# Patient Record
Sex: Female | Born: 1975 | Race: White | Hispanic: No | Smoking: Never smoker
Health system: Southern US, Community
[De-identification: ages and names within clinical notes are randomized; demographics above are authoritative.]

## PROBLEM LIST (undated history)

## (undated) DIAGNOSIS — L500 Allergic urticaria: Secondary | ICD-10-CM

## (undated) HISTORY — PX: ENDOMETRIAL ABLATION: SHX621

## (undated) HISTORY — PX: APPENDECTOMY: SHX54

## (undated) HISTORY — PX: TUBAL LIGATION: SHX77

## (undated) HISTORY — PX: BREAST BIOPSY: SHX20

## (undated) HISTORY — DX: Allergic urticaria: L50.0

## (undated) HISTORY — PX: TONSILLECTOMY: SUR1361

---

## 2005-04-01 ENCOUNTER — Ambulatory Visit (HOSPITAL_COMMUNITY): Admission: RE | Admit: 2005-04-01 | Discharge: 2005-04-01 | Payer: Self-pay | Admitting: Family Medicine

## 2005-05-14 ENCOUNTER — Emergency Department (HOSPITAL_COMMUNITY): Admission: EM | Admit: 2005-05-14 | Discharge: 2005-05-14 | Payer: Self-pay | Admitting: *Deleted

## 2006-12-29 ENCOUNTER — Ambulatory Visit (HOSPITAL_COMMUNITY): Admission: RE | Admit: 2006-12-29 | Discharge: 2006-12-29 | Payer: Self-pay | Admitting: Family Medicine

## 2007-11-22 ENCOUNTER — Other Ambulatory Visit: Admission: RE | Admit: 2007-11-22 | Discharge: 2007-11-22 | Payer: Self-pay | Admitting: Obstetrics and Gynecology

## 2008-03-12 ENCOUNTER — Ambulatory Visit (HOSPITAL_COMMUNITY): Admission: RE | Admit: 2008-03-12 | Discharge: 2008-03-12 | Payer: Self-pay | Admitting: Obstetrics and Gynecology

## 2008-03-12 ENCOUNTER — Encounter (INDEPENDENT_AMBULATORY_CARE_PROVIDER_SITE_OTHER): Payer: Self-pay | Admitting: Obstetrics and Gynecology

## 2010-02-22 ENCOUNTER — Other Ambulatory Visit: Admission: RE | Admit: 2010-02-22 | Discharge: 2010-02-22 | Payer: Self-pay | Admitting: Obstetrics and Gynecology

## 2011-04-19 NOTE — Op Note (Signed)
NAMEORLANDO, THALMANN              ACCOUNT NO.:  [id number]   MEDICAL RECORD NO.:  [id number]          PATIENT TYPE:  AMB   LOCATION:  SDC                           FACILITY:  [facility]   PHYSICIAN:  Gerald Leitz, MD          DATE OF BIRTH:  11/23/1976   DATE OF PROCEDURE:  03/12/2008  DATE OF DISCHARGE:                               OPERATIVE REPORT   PREOPERATIVE DIAGNOSIS:  Menorrhagia.   POSTOPERATIVE DIAGNOSIS:  Menorrhagia.   OPERATION/PROCEDURE:  Hysteroscopy, dilatation and curettage, and  endometrial ablation with the NovaSure.   SURGEON:  Gerald Leitz, M.D.   ASSISTANT:  None.   ANESTHESIA:  General.   FINDINGS:  Normal endometrial cavity.   SPECIMEN:  Endometrial curettings.  Disposition of specimen to  pathology.   ESTIMATED BLOOD LOSS:  Minimal.   FLUID DEFICIT:  40 mL.   COMPLICATIONS:  None   PROCEDURE:  The patient was taken to the operating room where she was  placed under general anesthesia.  She was placed in dorsal lithotomy  position, prepped and draped in the usual sterile fashion.  Bivalve  speculum was placed into the vaginal vault.  The anterior lip of cervix  was grasped with single-tooth tenaculum.  Marcaine 0.25% was injected  into the cervix at the 5 and 7 o'clock positions, 10 mL at the 5 o'clock  and 10 mL at the 7 o'clock positions.  The uterus sounded to 8.5 cm.  Endocervical length was 3.5 cm.  Cavity length was 5 cm.  Hysteroscope  was inserted with the findings noted above.  It was then removed.  The  cervix was dilated to approximately 8 mm.  Sharp curettage was performed  until a gritty texture was noted all way around.  The NovaSure device  was set at a cavity length of 5 cm.  It was reintroduced into the cervix  and endometrial cavity until the fundus was reached and then the  NovaSure array was deployed.  The cavity width was 4.2 cm.  Cavity  assessment test was performed and passed.  The endometrial ablation was  performed at a power of  116 for a total of 90 seconds.  The device was  allowed to cool.  It was then removed from the endometrial cavity and  cervix.  The hysteroscope was then  reinserted.  The cavity appeared adequately ablated.  There was no  evidence of perforation.  The hysteroscope was removed.  The single-  tooth tenaculum was removed from the anterior lip of the cervix.  Sponge, lap and needle counts were correct x2.  The patient was taken to  recovery room awake and in stable condition.      Gerald Leitz, MD  Electronically Signed     TC/MEDQ  D:  03/12/2008  T:  03/12/2008  Job:  [id number]

## 2011-04-19 NOTE — H&P (Signed)
[name], [name]              ACCOUNT NO.:  [id number]   MEDICAL RECORD NO.:  [id number]          PATIENT TYPE:  AMB   LOCATION:  [facility]C                           FACILITY:  [facility]   PHYSICIAN:  Gerald Leitz, MD          DATE OF BIRTH:  08/31/1976   DATE OF ADMISSION:  DATE OF DISCHARGE:                              HISTORY & PHYSICAL   The patient scheduled for surgery on March 12, 2008.   HISTORY OF PRESENT ILLNESS:  This is a 35 year old, G2, P2 with  menorrhagia and dysmenorrhea.  She has episodes where she has saturated  her clothing, and she desires treatment with endometrial ablation.  She  is not interested in future fertility.  Ultrasound performed on December 12, 2007, showed the uterus measured 8.59 cm in length.  AP diameter was  4.86 cm, and the width was 5.78 cm.  Endometrial thickness was 1.19 cm.  Endometrial biopsy performed on December 21, 2007, showed nonsecretory  endometrium.  No hyperplasia or malignancy was identified.   OB HISTORY:  Cesarean section x2.   GYN HISTORY:  Menarche at the age of 46.  Contraception tubal ligation.  Last Pap smear was November 22, 2007.  This was normal.   PAST MEDICAL HISTORY:  Hypothyroidism, gastric reflux disease, and  hypercholesterolemia.   PAST SURGICAL HISTORY:  Cesarean section x2, tonsillectomy,  appendectomy.   MEDICATIONS:  Zegerid, iron sulfate, and Alli.   ALLERGIES:  NO KNOWN DRUG ALLERGIES.   SOCIAL HISTORY:  The patient is married.  She denies tobacco use.  Occasional alcohol use.  No illicit drug use.   FAMILY HISTORY:  Negative for breast, ovarian, colon cancer.   REVIEW OF SYSTEMS:  Reflux otherwise negative except as stated in  history of current illness.   PHYSICAL EXAMINATION:  VITAL SIGNS:  Blood pressure of 120/74, weight  218 pounds, height 65 and 1/4 inch.  CARDIOVASCULAR:  Regular rate and rhythm.  LUNGS:  Clear to auscultation bilaterally.  ABDOMEN:  Soft, nontender, nondistended.  EXTREMITIES:   No clubbing, cyanosis, or edema.  PELVIC:  Normal external female genitalia.  No vulvar, vaginal, cervical  lesions noted.  BIMANUAL:  Slight tenderness on palpation of the uterus.  No adnexal  masses or tenderness appreciated.   IMPRESSION/PLAN:  A 35 year old with menorrhagia who desires therapy  with endometrial ablation.  Risks, benefits, and alternatives of  hysteroscopy, dilation and curettage, and ablation were discussed with  the patient  including but not limited to infection, bleeding, possible perforation  of uterus with need for further surgery.  The patient voiced  understanding of risks.  She accepts these risks and desires to proceed  with hysteroscopy, dilation and curettage, and endometrial ablation.      Gerald Leitz, MD  Electronically Signed     TC/MEDQ  D:  03/11/2008  T:  03/11/2008  Job:  [id number]

## 2011-08-30 LAB — BASIC METABOLIC PANEL
BUN: 6
CO2: 27
Calcium: 9
Chloride: 107
Creatinine, Ser: 0.66
GFR calc Af Amer: >60
GFR calc non Af Amer: >60
Glucose, Bld: 110 — ABNORMAL HIGH
Potassium: 4.1
Sodium: 140

## 2011-08-30 LAB — DIFFERENTIAL
Basophils Absolute: 0.1
Basophils Relative: 1
Eosinophils Absolute: 0.5
Eosinophils Relative: 5
Lymphocytes Relative: 26
Lymphs Abs: 2.7
Monocytes Absolute: 0.9
Monocytes Relative: 9
Neutro Abs: 6.2
Neutrophils Relative %: 60

## 2011-08-30 LAB — CBC
HCT: 40.9
Hemoglobin: 14.4
MCHC: 35.1
MCV: 83
Platelets: 205
RBC: 4.93
RDW: 16.8 — ABNORMAL HIGH
WBC: 10.5

## 2011-08-30 LAB — URINALYSIS, ROUTINE W REFLEX MICROSCOPIC
Bilirubin Urine: NEGATIVE
Glucose, UA: NEGATIVE
Hgb urine dipstick: NEGATIVE
Ketones, ur: NEGATIVE
Nitrite: NEGATIVE
Protein, ur: NEGATIVE
Specific Gravity, Urine: <1.005 — ABNORMAL LOW
Urobilinogen, UA: 0.2
pH: 7.5

## 2011-09-02 ENCOUNTER — Other Ambulatory Visit: Payer: Self-pay | Admitting: Obstetrics and Gynecology

## 2011-09-02 ENCOUNTER — Other Ambulatory Visit (HOSPITAL_COMMUNITY)
Admission: RE | Admit: 2011-09-02 | Discharge: 2011-09-02 | Disposition: A | Discharge status: Home / Self Care | Payer: Self-pay | Source: Ambulatory Visit | Attending: Obstetrics and Gynecology | Admitting: Obstetrics and Gynecology

## 2011-09-02 DIAGNOSIS — Z01419 Encounter for gynecological examination (general) (routine) without abnormal findings: Secondary | ICD-10-CM | POA: Insufficient documentation

## 2012-10-04 ENCOUNTER — Other Ambulatory Visit (HOSPITAL_COMMUNITY)
Admission: RE | Admit: 2012-10-04 | Discharge: 2012-10-04 | Disposition: A | Discharge status: Home / Self Care | Payer: Self-pay | Source: Ambulatory Visit | Attending: Obstetrics and Gynecology | Admitting: Obstetrics and Gynecology

## 2012-10-04 ENCOUNTER — Other Ambulatory Visit: Payer: Self-pay | Admitting: Obstetrics and Gynecology

## 2012-10-04 DIAGNOSIS — Z01419 Encounter for gynecological examination (general) (routine) without abnormal findings: Secondary | ICD-10-CM | POA: Insufficient documentation

## 2013-10-03 ENCOUNTER — Other Ambulatory Visit: Payer: Self-pay | Admitting: Obstetrics and Gynecology

## 2013-10-03 ENCOUNTER — Other Ambulatory Visit (HOSPITAL_COMMUNITY)
Admission: RE | Admit: 2013-10-03 | Discharge: 2013-10-03 | Disposition: A | Discharge status: Home / Self Care | Payer: Self-pay | Source: Ambulatory Visit | Attending: Obstetrics and Gynecology | Admitting: Obstetrics and Gynecology

## 2013-10-03 DIAGNOSIS — Z01419 Encounter for gynecological examination (general) (routine) without abnormal findings: Secondary | ICD-10-CM | POA: Insufficient documentation

## 2013-10-03 DIAGNOSIS — N63 Unspecified lump in unspecified breast: Secondary | ICD-10-CM

## 2013-10-03 DIAGNOSIS — Z1151 Encounter for screening for human papillomavirus (HPV): Secondary | ICD-10-CM | POA: Insufficient documentation

## 2013-10-18 ENCOUNTER — Ambulatory Visit
Admission: RE | Admit: 2013-10-18 | Discharge: 2013-10-18 | Disposition: A | Discharge status: Home / Self Care | Payer: No Typology Code available for payment source | Source: Ambulatory Visit | Attending: Obstetrics and Gynecology | Admitting: Obstetrics and Gynecology

## 2013-10-18 DIAGNOSIS — N63 Unspecified lump in unspecified breast: Secondary | ICD-10-CM

## 2014-10-08 ENCOUNTER — Other Ambulatory Visit (HOSPITAL_COMMUNITY)
Admission: RE | Admit: 2014-10-08 | Discharge: 2014-10-08 | Disposition: A | Discharge status: Home / Self Care | Payer: BC Managed Care – PPO | Source: Ambulatory Visit | Attending: Obstetrics and Gynecology | Admitting: Obstetrics and Gynecology

## 2014-10-08 ENCOUNTER — Other Ambulatory Visit: Payer: Self-pay | Admitting: Obstetrics and Gynecology

## 2014-10-08 DIAGNOSIS — Z01419 Encounter for gynecological examination (general) (routine) without abnormal findings: Secondary | ICD-10-CM | POA: Diagnosis not present

## 2014-10-10 LAB — CYTOLOGY - PAP

## 2015-10-13 ENCOUNTER — Other Ambulatory Visit: Payer: Self-pay | Admitting: Obstetrics and Gynecology

## 2015-10-13 ENCOUNTER — Other Ambulatory Visit (HOSPITAL_COMMUNITY)
Admission: RE | Admit: 2015-10-13 | Discharge: 2015-10-13 | Disposition: A | Discharge status: Home / Self Care | Payer: 59 | Source: Ambulatory Visit | Attending: Obstetrics and Gynecology | Admitting: Obstetrics and Gynecology

## 2015-10-13 DIAGNOSIS — Z01419 Encounter for gynecological examination (general) (routine) without abnormal findings: Secondary | ICD-10-CM | POA: Diagnosis not present

## 2015-10-14 LAB — CYTOLOGY - PAP

## 2015-12-25 ENCOUNTER — Other Ambulatory Visit: Payer: Self-pay | Admitting: Surgical Oncology

## 2015-12-25 DIAGNOSIS — K219 Gastro-esophageal reflux disease without esophagitis: Secondary | ICD-10-CM

## 2015-12-28 ENCOUNTER — Other Ambulatory Visit: Payer: Self-pay

## 2016-03-02 ENCOUNTER — Other Ambulatory Visit: Payer: Self-pay | Admitting: Gastroenterology

## 2016-03-02 DIAGNOSIS — R1310 Dysphagia, unspecified: Secondary | ICD-10-CM

## 2016-03-02 DIAGNOSIS — K219 Gastro-esophageal reflux disease without esophagitis: Secondary | ICD-10-CM

## 2016-03-07 ENCOUNTER — Ambulatory Visit
Admission: RE | Admit: 2016-03-07 | Discharge: 2016-03-07 | Disposition: A | Discharge status: Home / Self Care | Payer: BLUE CROSS/BLUE SHIELD | Source: Ambulatory Visit | Attending: Gastroenterology | Admitting: Gastroenterology

## 2016-03-07 DIAGNOSIS — R1310 Dysphagia, unspecified: Secondary | ICD-10-CM

## 2016-03-07 DIAGNOSIS — K219 Gastro-esophageal reflux disease without esophagitis: Secondary | ICD-10-CM

## 2016-04-04 ENCOUNTER — Other Ambulatory Visit: Payer: Self-pay | Admitting: Gastroenterology

## 2016-06-14 ENCOUNTER — Ambulatory Visit: Payer: Self-pay | Admitting: Allergy and Immunology

## 2016-06-16 ENCOUNTER — Ambulatory Visit (INDEPENDENT_AMBULATORY_CARE_PROVIDER_SITE_OTHER): Payer: BLUE CROSS/BLUE SHIELD | Admitting: Allergy and Immunology

## 2016-06-16 ENCOUNTER — Encounter (INDEPENDENT_AMBULATORY_CARE_PROVIDER_SITE_OTHER): Payer: Self-pay

## 2016-06-16 ENCOUNTER — Encounter: Payer: Self-pay | Admitting: Allergy and Immunology

## 2016-06-16 VITALS — BP 116/70 | HR 92 | Temp 98.3°F | Resp 16 | Ht 64.76 in | Wt 237.4 lb

## 2016-06-16 DIAGNOSIS — J3089 Other allergic rhinitis: Secondary | ICD-10-CM | POA: Insufficient documentation

## 2016-06-16 DIAGNOSIS — K200 Eosinophilic esophagitis: Secondary | ICD-10-CM | POA: Diagnosis not present

## 2016-06-16 DIAGNOSIS — H1045 Other chronic allergic conjunctivitis: Secondary | ICD-10-CM

## 2016-06-16 DIAGNOSIS — H1010 Acute atopic conjunctivitis, unspecified eye: Secondary | ICD-10-CM

## 2016-06-16 MED ORDER — OLOPATADINE HCL 0.2 % OP SOLN: 1.0000 [drp] | OPHTHALMIC | Status: DC

## 2016-06-16 MED ORDER — FLUTICASONE PROPIONATE 50 MCG/ACT NA SUSP: NASAL | Status: DC

## 2016-06-16 NOTE — Progress Notes (Addendum)
New Patient Note  RE: [name] MRN: [id number] DOB: 17-Apr-1976 Date of Office Visit: 06/16/2016  Referring provider: Willis Modenautlaw, William, MD Primary care provider: Colette RibasGOLDING, JOHN CABOT, MD  Chief Complaint: Allergy Testing   History of present illness: HPI Comments: [name] is a 40 y.o. female presenting today for initial consultation.  She has had dysphagia since childhood.  She is diagnosed with esophageal stricture when she was 40 years old and started on a proton pump inhibitor, though she still experienced symptoms. She was diagnosed with eosinophilic esophagitis in May 2017 by Dr. Dulce Sellarutlaw, her gastroenterologist, with EGD/biopsy.  She was started on Flovent 220 g, one puff swallowed twice a day.  She has noticed symptom reduction on this regimen, though still experiences occasional dysphagia, particularly when consuming cheeseburgers.  She is scheduled to follow up with Dr. Dulce Sellarutlaw in August.  She experiences nasal congestion, rhinorrhea, and ocular pruritus. No significant seasonal symptom variation has been noted. She has noticed increased nasal and ocular symptoms when she is around cats or dogs.  She has prescriptions for fluticasone nasal spray and olopatadine eyedrops for the nasal/ocular symptoms.   Assessment and plan: Eosinophilic esophagitis [name] has concomitant perennial and seasonal allergic rhinitis.  Food allergen skin prick tests were negative today despite a positive histamine control. The negative predictive value for food allergen skin testing is excellent, with the exception of milk. However, false negatives may occur, particularly with milk. Therefore if symptoms persist or progress, a trial six food elimination diet (milk, soy, eggs, wheat, peanuts/tree nuts, and seafood), or at least a trial elimination of milk, is a reasonable consideration.  Aeroallergen avoidance measures have been discussed and provided in written form.  Continue Flovent 220 g,  one puff swallowed twice a day.  Follow up with Dr. Dulce Sellarutlaw as scheduled in August for monitoring.  Perennial and seasonal allergic rhinitis  Allergen avoidance measures.  A prescription has been provided for fluticasone nasal spray, 2 sprays per nostril daily as needed. Proper nasal spray technique has been discussed and demonstrated.  I have also recommended nasal saline spray (i.e., Simply Saline) or nasal saline lavage (i.e., NeilMed) as needed prior to medicated nasal sprays.  If allergen avoidance measures and medications fail to adequately relieve symptoms, aeroallergen immunotherapy will be considered.  Seasonal allergic conjunctivitis  Treatment plan as outlined above for allergic rhinitis.  A prescription has been provided for Pazeo, one drop per eye daily as needed.    Meds ordered this encounter  Medications  . fluticasone (FLONASE) 50 MCG/ACT nasal spray    Sig: USE 2 SPRAYS IN EACH NOSTRIL ONCE DAILY FOR STUFFY NOSE OR DRAINAGE.    Dispense:  16 g    Refill:  5  . Olopatadine HCl (PATADAY) 0.2 % SOLN    Sig: Place 1 drop into both eyes 1 day or 1 dose.    Dispense:  1 Bottle    Refill:  5    Diagnositics: Environmental skin testing: Positive to grass pollen, weed pollen, ragweed pollen, tree pollen, mold, cat hair, dog epithelia, and dust mite antigen. Food allergen skin testing:  Negative despite a positive histamine control.    Physical examination: Blood pressure 116/70, pulse 92, temperature 98.3 F (36.8 C), temperature source Oral, resp. rate 16, height 5' 4.76" (1.645 m), weight 237 lb 7 oz (107.7 kg), SpO2 96 %.  General: Alert, interactive, in no acute distress. HEENT: TMs pearly gray, turbinates mildly edematous without discharge, post-pharynx mildly erythematous. Neck: Supple  without lymphadenopathy. Lungs: Clear to auscultation without wheezing, rhonchi or rales. CV: Normal S1, S2 without murmurs. Abdomen: Nondistended, nontender. Skin: Warm  and dry, without lesions or rashes. Extremities:  No clubbing, cyanosis or edema. Neuro:   Grossly intact.  Review of systems:  Review of Systems  Constitutional: Negative for fever, chills and weight loss.  HENT: Positive for congestion. Negative for nosebleeds.   Eyes: Negative for blurred vision.  Respiratory: Negative for hemoptysis.   Cardiovascular: Negative for chest pain.  Gastrointestinal: Positive for heartburn. Negative for diarrhea and constipation.  Genitourinary: Negative for dysuria.  Musculoskeletal: Negative for myalgias and joint pain.  Neurological: Negative for dizziness.  Endo/Heme/Allergies: Does not bruise/bleed easily.    Past medical history:  Eosinophilic esophagitis  Past surgical history:  Past Surgical History  Procedure Laterality Date  . Tonsillectomy    . Appendectomy    . Cesarean section      X 2  . Tubal ligation    . Endometrial ablation      Family history: Family History  Problem Relation Age of Onset  . Allergic rhinitis Neg Hx   . Asthma Neg Hx   . Angioedema Neg Hx   . Eczema Neg Hx   . Immunodeficiency Neg Hx   . Urticaria Neg Hx     Social history: Social History   Social History  . Marital Status: Married    Spouse Name: N/A  . Number of Children: N/A  . Years of Education: N/A   Occupational History  . Not on file.   Social History Main Topics  . Smoking status: Never Smoker   . Smokeless tobacco: Never Used  . Alcohol Use: Not on file  . Drug Use: Not on file  . Sexual Activity: Not on file   Other Topics Concern  . Not on file   Social History Narrative  . No narrative on file   Environmental History: The patient lives in a 40 year old house with hardwood floors throughout and central air/heat.  She is a nonsmoker without pets.    Medication List       This list is accurate as of: 06/16/16  6:56 PM.  Always use your most recent med list.               cyclobenzaprine 10 MG tablet  Commonly  known as:  FLEXERIL  TK 1 T PO TID PRF     FLOVENT HFA 220 MCG/ACT inhaler  Generic drug:  fluticasone  Inhale 1 puff into the lungs 2 (two) times daily. Eosinophilic esophagitis     fluticasone 50 MCG/ACT nasal spray  Commonly known as:  FLONASE  USE 2 SPRAYS IN EACH NOSTRIL ONCE DAILY FOR STUFFY NOSE OR DRAINAGE.     Magnesium 250 MG Tabs  Take by mouth.     meloxicam 15 MG tablet  Commonly known as:  MOBIC  TK 1 T PO D     multivitamin capsule  Take by mouth.     Olopatadine HCl 0.2 % Soln  Commonly known as:  PATADAY  Place 1 drop into both eyes 1 day or 1 dose.     omega-3 acid ethyl esters 1 g capsule  Commonly known as:  LOVAZA  Take by mouth 2 (two) times daily.     phentermine 30 MG capsule  Take 30 mg by mouth.     pravastatin 20 MG tablet  Commonly known as:  PRAVACHOL        Known medication  allergies: No Known Allergies  I appreciate the opportunity to take part in Dorea's care. Please do not hesitate to contact me with questions.  Sincerely,   R. Jorene Guest, MD

## 2016-06-16 NOTE — Assessment & Plan Note (Addendum)
   Allergen avoidance measures.  A prescription has been provided for fluticasone nasal spray, 2 sprays per nostril daily as needed. Proper nasal spray technique has been discussed and demonstrated.  I have also recommended nasal saline spray (i.e., Simply Saline) or nasal saline lavage (i.e., NeilMed) as needed prior to medicated nasal sprays.  If allergen avoidance measures and medications fail to adequately relieve symptoms, aeroallergen immunotherapy will be considered.

## 2016-06-16 NOTE — Assessment & Plan Note (Deleted)
   Aeroallergen avoidance measures have been discussed and provided in written form.  A prescription has been provided for fluticasone nasal spray, 2 sprays per nostril daily as needed. Proper nasal spray technique has been discussed and demonstrated.  I have also recommended nasal saline spray (i.e., Simply Saline) or nasal saline lavage (i.e., NeilMed) as needed prior to medicated nasal sprays.  A prescription has been provided for Pazeo, one drop per eye daily as needed.  If allergen avoidance measures and medications fail to adequately relieve symptoms, aeroallergen immunotherapy will be considered.

## 2016-06-16 NOTE — Assessment & Plan Note (Signed)
   Treatment plan as outlined above for allergic rhinitis.  A prescription has been provided for Pazeo, one drop per eye daily as needed. 

## 2016-06-16 NOTE — Patient Instructions (Addendum)
Eosinophilic esophagitis Victorino DikeJennifer has concomitant perennial and seasonal allergic rhinitis.  Food allergen skin prick tests were negative today despite a positive histamine control. The negative predictive value for food allergen skin testing is excellent, with the exception of milk. However, false negatives may occur, particularly with milk. Therefore if symptoms persist or progress, a trial six food elimination diet (milk, soy, eggs, wheat, peanuts/tree nuts, and seafood), or at least a trial elimination of milk, is a reasonable consideration.  Aeroallergen avoidance measures have been discussed and provided in written form.  Continue Flovent 220 g, one puff swallowed twice a day.  Follow up with Dr. Dulce Sellarutlaw as scheduled in August for monitoring.  Perennial and seasonal allergic rhinitis  Allergen avoidance measures.  A prescription has been provided for fluticasone nasal spray, 2 sprays per nostril daily as needed. Proper nasal spray technique has been discussed and demonstrated.  I have also recommended nasal saline spray (i.e., Simply Saline) or nasal saline lavage (i.e., NeilMed) as needed prior to medicated nasal sprays.  If allergen avoidance measures and medications fail to adequately relieve symptoms, aeroallergen immunotherapy will be considered.  Seasonal allergic conjunctivitis  Treatment plan as outlined above for allergic rhinitis.  A prescription has been provided for Pazeo, one drop per eye daily as needed.    Return in about 6 months (around 12/17/2016), or if symptoms worsen or fail to improve.  Reducing Pollen Exposure  The American Academy of Allergy, Asthma and Immunology suggests the following steps to reduce your exposure to pollen during allergy seasons.    1. Do not hang sheets or clothing out to dry; pollen may collect on these items. 2. Do not mow lawns or spend time around freshly cut grass; mowing stirs up pollen. 3. Keep windows closed at night.  Keep  car windows closed while driving. 4. Minimize morning activities outdoors, a time when pollen counts are usually at their highest. 5. Stay indoors as much as possible when pollen counts or humidity is high and on windy days when pollen tends to remain in the air longer. 6. Use air conditioning when possible.  Many air conditioners have filters that trap the pollen spores. 7. Use a HEPA room air filter to remove pollen form the indoor air you breathe.   Control of House Dust Mite Allergen  House dust mites play a major role in allergic asthma and rhinitis.  They occur in environments with high humidity wherever human skin, the food for dust mites is found. High levels have been detected in dust obtained from mattresses, pillows, carpets, upholstered furniture, bed covers, clothes and soft toys.  The principal allergen of the house dust mite is found in its feces.  A gram of dust may contain 1,000 mites and 250,000 fecal particles.  Mite antigen is easily measured in the air during house cleaning activities.    1. Encase mattresses, including the box spring, and pillow, in an air tight cover.  Seal the zipper end of the encased mattresses with wide adhesive tape. 2. Wash the bedding in water of 130 degrees Farenheit weekly.  Avoid cotton comforters/quilts and flannel bedding: the most ideal bed covering is the dacron comforter. 3. Remove all upholstered furniture from the bedroom. 4. Remove carpets, carpet padding, rugs, and non-washable window drapes from the bedroom.  Wash drapes weekly or use plastic window coverings. 5. Remove all non-washable stuffed toys from the bedroom.  Wash stuffed toys weekly. 6. Have the room cleaned frequently with a vacuum cleaner and a  damp dust-mop.  The patient should not be in a room which is being cleaned and should wait 1 hour after cleaning before going into the room. 7. Close and seal all heating outlets in the bedroom.  Otherwise, the room will become filled  with dust-laden air.  An electric heater can be used to heat the room. 8. Reduce indoor humidity to less than 50%.  Do not use a humidifier.  Control of Dog or Cat Allergen  Avoidance is the best way to manage a dog or cat allergy. If you have a dog or cat and are allergic to dog or cats, consider removing the dog or cat from the home. If you have a dog or cat but don't want to find it a new home, or if your family wants a pet even though someone in the household is allergic, here are some strategies that may help keep symptoms at bay:  1. Keep the pet out of your bedroom and restrict it to only a few rooms. Be advised that keeping the dog or cat in only one room will not limit the allergens to that room. 2. Don't pet, hug or kiss the dog or cat; if you do, wash your hands with soap and water. 3. High-efficiency particulate air (HEPA) cleaners run continuously in a bedroom or living room can reduce allergen levels over time. 4. Regular use of a high-efficiency vacuum cleaner or a central vacuum can reduce allergen levels. 5. Giving your dog or cat a bath at least once a week can reduce airborne allergen.  Control of Mold Allergen  Mold and fungi can grow on a variety of surfaces provided certain temperature and moisture conditions exist.  Outdoor molds grow on plants, decaying vegetation and soil.  The major outdoor mold, Alternaria and Cladosporium, are found in very high numbers during hot and dry conditions.  Generally, a late Summer - Fall peak is seen for common outdoor fungal spores.  Rain will temporarily lower outdoor mold spore count, but counts rise rapidly when the rainy period ends.  The most important indoor molds are Aspergillus and Penicillium.  Dark, humid and poorly ventilated basements are ideal sites for mold growth.  The next most common sites of mold growth are the bathroom and the kitchen.  Outdoor [employer] 2. Use air conditioning and keep windows closed 3. Avoid  exposure to decaying vegetation. 4. Avoid leaf raking. 5. Avoid grain handling. 6. Consider wearing a face mask if working in moldy areas.  Indoor Mold Control 1. Maintain humidity below 50%. 2. Clean washable surfaces with 5% bleach solution. 3. Remove sources e.g. Contaminated carpets.

## 2016-06-16 NOTE — Assessment & Plan Note (Addendum)
Virginia DikeJennifer has concomitant perennial and seasonal allergic rhinitis.  Food allergen skin prick tests were negative today despite a positive histamine control. The negative predictive value for food allergen skin testing is excellent, with the exception of milk. However, false negatives may occur, particularly with milk. Therefore if symptoms persist or progress, a trial six food elimination diet (milk, soy, eggs, wheat, peanuts/tree nuts, and seafood), or at least a trial elimination of milk, is a reasonable consideration.  Aeroallergen avoidance measures have been discussed and provided in written form.  Continue Flovent 220 g, one puff swallowed twice a day.  Follow up with Dr. Dulce Sellarutlaw as scheduled in August for monitoring.

## 2016-07-05 ENCOUNTER — Encounter: Payer: Self-pay | Admitting: *Deleted

## 2016-10-14 ENCOUNTER — Other Ambulatory Visit (HOSPITAL_COMMUNITY)
Admission: RE | Admit: 2016-10-14 | Discharge: 2016-10-14 | Disposition: A | Discharge status: Home / Self Care | Payer: BLUE CROSS/BLUE SHIELD | Source: Ambulatory Visit | Attending: Obstetrics and Gynecology | Admitting: Obstetrics and Gynecology

## 2016-10-14 ENCOUNTER — Other Ambulatory Visit: Payer: Self-pay | Admitting: Obstetrics and Gynecology

## 2016-10-14 DIAGNOSIS — Z01419 Encounter for gynecological examination (general) (routine) without abnormal findings: Secondary | ICD-10-CM | POA: Diagnosis present

## 2016-10-14 DIAGNOSIS — Z1151 Encounter for screening for human papillomavirus (HPV): Secondary | ICD-10-CM | POA: Diagnosis present

## 2016-10-20 LAB — CYTOLOGY - PAP
Diagnosis: NEGATIVE
HPV: NOT DETECTED

## 2016-10-21 ENCOUNTER — Other Ambulatory Visit: Payer: Self-pay | Admitting: Obstetrics and Gynecology

## 2016-10-21 DIAGNOSIS — Z1231 Encounter for screening mammogram for malignant neoplasm of breast: Secondary | ICD-10-CM

## 2016-11-14 ENCOUNTER — Ambulatory Visit
Admission: RE | Admit: 2016-11-14 | Discharge: 2016-11-14 | Disposition: A | Discharge status: Home / Self Care | Payer: BLUE CROSS/BLUE SHIELD | Source: Ambulatory Visit | Attending: Obstetrics and Gynecology | Admitting: Obstetrics and Gynecology

## 2016-11-14 DIAGNOSIS — Z1231 Encounter for screening mammogram for malignant neoplasm of breast: Secondary | ICD-10-CM

## 2016-12-19 ENCOUNTER — Ambulatory Visit (INDEPENDENT_AMBULATORY_CARE_PROVIDER_SITE_OTHER): Payer: BLUE CROSS/BLUE SHIELD | Admitting: Allergy and Immunology

## 2016-12-19 ENCOUNTER — Encounter: Payer: Self-pay | Admitting: Allergy and Immunology

## 2016-12-19 VITALS — BP 128/74 | HR 84 | Temp 98.3°F | Resp 66 | Ht 64.75 in | Wt 222.0 lb

## 2016-12-19 DIAGNOSIS — H1045 Other chronic allergic conjunctivitis: Secondary | ICD-10-CM

## 2016-12-19 DIAGNOSIS — L500 Allergic urticaria: Secondary | ICD-10-CM | POA: Diagnosis not present

## 2016-12-19 DIAGNOSIS — K200 Eosinophilic esophagitis: Secondary | ICD-10-CM

## 2016-12-19 DIAGNOSIS — J3089 Other allergic rhinitis: Secondary | ICD-10-CM

## 2016-12-19 DIAGNOSIS — H1010 Acute atopic conjunctivitis, unspecified eye: Secondary | ICD-10-CM

## 2016-12-19 HISTORY — DX: Allergic urticaria: L50.0

## 2016-12-19 MED ORDER — LEVOCETIRIZINE DIHYDROCHLORIDE 5 MG PO TABS: 5.0000 mg | ORAL_TABLET | Freq: Every evening | ORAL | 5 refills | Status: DC

## 2016-12-19 NOTE — Patient Instructions (Addendum)
Allergic urticaria  Avoid dogs if possible.  A prescription has been provided for levocetirizine, 5mg  daily as needed and prior to contact with dogs.  Should significant symptoms recur or new symptoms occur, a journal is to be kept recording any foods eaten, beverages consumed, medications taken, activities performed, and environmental conditions within a 6 hour time period prior to the onset of symptoms. For any symptoms concerning for anaphylaxis, 911 is to be called immediately.   Eosinophilic esophagitis Well-controlled.  For now, continue current regimen.  Follow up with Dr. Dulce Sellarutlaw for monitoring as directed.  Perennial and seasonal allergic rhinitis  Continue appropriate allergen avoidance measures, fluticasone nasal spray, and nasal saline irrigation as needed.   Return if symptoms worsen or fail to improve.

## 2016-12-19 NOTE — Assessment & Plan Note (Addendum)
   Avoid dogs if possible.  A prescription has been provided for levocetirizine, 5mg  daily as needed and prior to contact with dogs.  Should significant symptoms recur or new symptoms occur, a journal is to be kept recording any foods eaten, beverages consumed, medications taken, activities performed, and environmental conditions within a 6 hour time period prior to the onset of symptoms. For any symptoms concerning for anaphylaxis, 911 is to be called immediately.

## 2016-12-19 NOTE — Progress Notes (Signed)
Follow-up Note  RE: [name] MRN: [id number] DOB: 11/29/1976 Date of Office Visit: 12/19/2016  Primary care provider: Colette Ribas, MD Referring provider: Assunta Found, MD  History of present illness: [name] is a 41 y.o. female with eosinophilic esophagitis and allergic rhinoconjunctivitis presents today for follow up.  She was previously seen in this clinic for her initial evaluation in July 2017.  She reports that she has developed hives when in contact with dogs. She is currently not taking an antihistamine.  She has no nasal symptom complaints today. She reports that her eosinophilic esophagitis is been well-controlled while using Flovent and Flonase as prescribed.  She is followed by Dr. Dulce Sellar.     Assessment and plan: Allergic urticaria  Avoid dogs if possible.  A prescription has been provided for levocetirizine, 5mg  daily as needed and prior to contact with dogs.  Should significant symptoms recur or new symptoms occur, a journal is to be kept recording any foods eaten, beverages consumed, medications taken, activities performed, and environmental conditions within a 6 hour time period prior to the onset of symptoms. For any symptoms concerning for anaphylaxis, 911 is to be called immediately.   Eosinophilic esophagitis Well-controlled.  For now, continue current regimen.  Follow up with Dr. Dulce Sellar for monitoring as directed.  Perennial and seasonal allergic rhinitis  Continue appropriate allergen avoidance measures, fluticasone nasal spray, and nasal saline irrigation as needed.   Meds ordered this encounter  Medications  . levocetirizine (XYZAL) 5 MG tablet    Sig: Take 1 tablet (5 mg total) by mouth every evening.    Dispense:  30 tablet    Refill:  5    Physical examination: Blood pressure 128/74, pulse 84, temperature 98.3 F (36.8 C), temperature source Oral, resp. rate (!) 66, height 5' 4.75" (1.645 m), weight 222 lb (100.7 kg),  SpO2 97 %.  General: Alert, interactive, in no acute distress. HEENT: TMs pearly gray, turbinates minimally edematous without discharge, post-pharynx mildly erythematous. Neck: Supple without lymphadenopathy. Lungs: Clear to auscultation without wheezing, rhonchi or rales. CV: Normal S1, S2 without murmurs. Skin: Warm and dry, without lesions or rashes.  The following portions of the patient's history were reviewed and updated as appropriate: allergies, current medications, past family history, past medical history, past social history, past surgical history and problem list.  Allergies as of 12/19/2016   No Known Allergies     Medication List       Accurate as of 12/19/16  1:14 PM. Always use your most recent med list.          cyclobenzaprine 10 MG tablet Commonly known as:  FLEXERIL TK 1 T PO TID PRF   FLOVENT HFA 220 MCG/ACT inhaler Generic drug:  fluticasone Inhale 1 puff into the lungs 2 (two) times daily as needed. Eosinophilic esophagitis   fluticasone 50 MCG/ACT nasal spray Commonly known as:  FLONASE USE 2 SPRAYS IN EACH NOSTRIL ONCE DAILY FOR STUFFY NOSE OR DRAINAGE.   FLUVIRIN Susp Generic drug:  Influenza Vac Typ A&B Surf Ant Inject 0.5 mLs into the muscle as directed.   levocetirizine 5 MG tablet Commonly known as:  XYZAL Take 1 tablet (5 mg total) by mouth every evening.   Magnesium 250 MG Tabs Take by mouth.   meloxicam 15 MG tablet Commonly known as:  MOBIC TK 1 T PO D   multivitamin capsule Take by mouth.   Olopatadine HCl 0.2 % Soln Commonly known as:  PATADAY  Place 1 drop into both eyes 1 day or 1 dose.   omega-3 acid ethyl esters 1 g capsule Commonly known as:  LOVAZA Take by mouth 2 (two) times daily.   phentermine 30 MG capsule Take 30 mg by mouth.   pravastatin 20 MG tablet Commonly known as:  PRAVACHOL       No Known Allergies  I appreciate the opportunity to take part in [name]'s care. Please do not hesitate to contact  me with questions.  Sincerely,   R. Jorene Guestarter Feleica Fulmore, MD

## 2016-12-19 NOTE — Assessment & Plan Note (Signed)
Well-controlled.  For now, continue current regimen.  Follow up with Dr. Dulce Sellarutlaw for monitoring as directed.

## 2016-12-19 NOTE — Assessment & Plan Note (Signed)
   Continue appropriate allergen avoidance measures, fluticasone nasal spray, and nasal saline irrigation as needed.

## 2017-01-23 ENCOUNTER — Other Ambulatory Visit: Payer: Self-pay | Admitting: Allergy and Immunology

## 2017-01-23 DIAGNOSIS — J3089 Other allergic rhinitis: Secondary | ICD-10-CM

## 2017-06-05 ENCOUNTER — Other Ambulatory Visit: Payer: Self-pay | Admitting: Allergy and Immunology

## 2017-06-05 DIAGNOSIS — J3089 Other allergic rhinitis: Secondary | ICD-10-CM

## 2017-11-20 ENCOUNTER — Ambulatory Visit (HOSPITAL_COMMUNITY)
Admission: RE | Admit: 2017-11-20 | Discharge: 2017-11-20 | Disposition: A | Discharge status: Home / Self Care | Payer: BLUE CROSS/BLUE SHIELD | Source: Ambulatory Visit | Attending: Physician Assistant | Admitting: Physician Assistant

## 2017-11-20 ENCOUNTER — Other Ambulatory Visit (HOSPITAL_COMMUNITY): Payer: Self-pay | Admitting: Physician Assistant

## 2017-11-20 DIAGNOSIS — M94262 Chondromalacia, left knee: Secondary | ICD-10-CM

## 2017-11-20 DIAGNOSIS — M1712 Unilateral primary osteoarthritis, left knee: Secondary | ICD-10-CM | POA: Insufficient documentation

## 2017-11-21 ENCOUNTER — Other Ambulatory Visit: Payer: Self-pay | Admitting: Obstetrics and Gynecology

## 2017-11-21 DIAGNOSIS — Z1231 Encounter for screening mammogram for malignant neoplasm of breast: Secondary | ICD-10-CM

## 2017-12-19 ENCOUNTER — Ambulatory Visit
Admission: RE | Admit: 2017-12-19 | Discharge: 2017-12-19 | Disposition: A | Discharge status: Home / Self Care | Payer: BLUE CROSS/BLUE SHIELD | Source: Ambulatory Visit | Attending: Obstetrics and Gynecology | Admitting: Obstetrics and Gynecology

## 2017-12-19 DIAGNOSIS — Z1231 Encounter for screening mammogram for malignant neoplasm of breast: Secondary | ICD-10-CM

## 2017-12-25 ENCOUNTER — Other Ambulatory Visit: Payer: Self-pay | Admitting: Allergy and Immunology

## 2017-12-25 DIAGNOSIS — J3089 Other allergic rhinitis: Secondary | ICD-10-CM

## 2018-05-17 ENCOUNTER — Other Ambulatory Visit: Payer: Self-pay | Admitting: Allergy and Immunology

## 2018-05-17 DIAGNOSIS — J3089 Other allergic rhinitis: Secondary | ICD-10-CM

## 2018-06-26 ENCOUNTER — Other Ambulatory Visit: Payer: Self-pay | Admitting: Allergy and Immunology

## 2018-06-26 DIAGNOSIS — J3089 Other allergic rhinitis: Secondary | ICD-10-CM

## 2018-06-27 ENCOUNTER — Other Ambulatory Visit: Payer: Self-pay | Admitting: Allergy and Immunology

## 2018-06-27 DIAGNOSIS — J3089 Other allergic rhinitis: Secondary | ICD-10-CM

## 2018-11-07 ENCOUNTER — Other Ambulatory Visit: Payer: Self-pay | Admitting: Obstetrics and Gynecology

## 2018-11-07 DIAGNOSIS — Z1231 Encounter for screening mammogram for malignant neoplasm of breast: Secondary | ICD-10-CM

## 2018-12-20 ENCOUNTER — Ambulatory Visit
Admission: RE | Admit: 2018-12-20 | Discharge: 2018-12-20 | Disposition: A | Discharge status: Home / Self Care | Payer: BLUE CROSS/BLUE SHIELD | Source: Ambulatory Visit | Attending: Obstetrics and Gynecology | Admitting: Obstetrics and Gynecology

## 2018-12-20 DIAGNOSIS — Z1231 Encounter for screening mammogram for malignant neoplasm of breast: Secondary | ICD-10-CM

## 2019-10-29 ENCOUNTER — Other Ambulatory Visit: Payer: Self-pay | Admitting: Obstetrics and Gynecology

## 2019-10-29 ENCOUNTER — Other Ambulatory Visit (HOSPITAL_COMMUNITY)
Admission: RE | Admit: 2019-10-29 | Discharge: 2019-10-29 | Disposition: A | Discharge status: Home / Self Care | Payer: BC Managed Care – PPO | Source: Ambulatory Visit | Attending: Obstetrics and Gynecology | Admitting: Obstetrics and Gynecology

## 2019-10-29 DIAGNOSIS — Z01419 Encounter for gynecological examination (general) (routine) without abnormal findings: Secondary | ICD-10-CM | POA: Insufficient documentation

## 2019-11-01 LAB — CYTOLOGY - PAP
Adequacy: ABSENT
Comment: NEGATIVE
Diagnosis: NEGATIVE
High risk HPV: NEGATIVE

## 2020-01-20 ENCOUNTER — Other Ambulatory Visit: Payer: Self-pay | Admitting: Obstetrics and Gynecology

## 2020-01-20 DIAGNOSIS — Z1231 Encounter for screening mammogram for malignant neoplasm of breast: Secondary | ICD-10-CM

## 2020-02-24 ENCOUNTER — Ambulatory Visit: Payer: BC Managed Care – PPO

## 2020-02-25 ENCOUNTER — Other Ambulatory Visit: Payer: Self-pay

## 2020-02-25 ENCOUNTER — Ambulatory Visit
Admission: RE | Admit: 2020-02-25 | Discharge: 2020-02-25 | Disposition: A | Discharge status: Home / Self Care | Payer: 59 | Source: Ambulatory Visit | Attending: Obstetrics and Gynecology | Admitting: Obstetrics and Gynecology

## 2020-02-25 DIAGNOSIS — Z1231 Encounter for screening mammogram for malignant neoplasm of breast: Secondary | ICD-10-CM

## 2020-09-08 ENCOUNTER — Other Ambulatory Visit: Payer: Self-pay

## 2020-09-08 ENCOUNTER — Other Ambulatory Visit: Payer: Self-pay | Admitting: *Deleted

## 2020-09-08 DIAGNOSIS — Z20822 Contact with and (suspected) exposure to covid-19: Secondary | ICD-10-CM

## 2020-09-09 LAB — NOVEL CORONAVIRUS, NAA: SARS-CoV-2, NAA: NOT DETECTED

## 2020-09-09 LAB — SARS-COV-2, NAA 2 DAY TAT

## 2020-09-09 LAB — SPECIMEN STATUS REPORT

## 2020-10-28 ENCOUNTER — Other Ambulatory Visit: Payer: Self-pay

## 2020-10-28 ENCOUNTER — Ambulatory Visit (HOSPITAL_COMMUNITY)
Admission: RE | Admit: 2020-10-28 | Discharge: 2020-10-28 | Disposition: A | Discharge status: Home / Self Care | Payer: BC Managed Care – PPO | Source: Ambulatory Visit | Attending: Family Medicine | Admitting: Family Medicine

## 2020-10-28 ENCOUNTER — Other Ambulatory Visit (HOSPITAL_COMMUNITY): Payer: Self-pay | Admitting: Family Medicine

## 2020-10-28 DIAGNOSIS — R053 Chronic cough: Secondary | ICD-10-CM

## 2020-11-03 ENCOUNTER — Other Ambulatory Visit: Payer: BC Managed Care – PPO

## 2020-11-03 ENCOUNTER — Other Ambulatory Visit: Payer: Self-pay

## 2020-11-03 DIAGNOSIS — Z20822 Contact with and (suspected) exposure to covid-19: Secondary | ICD-10-CM

## 2020-11-04 LAB — NOVEL CORONAVIRUS, NAA: SARS-CoV-2, NAA: NOT DETECTED

## 2020-11-04 LAB — SPECIMEN STATUS REPORT

## 2020-11-04 LAB — SARS-COV-2, NAA 2 DAY TAT

## 2020-11-23 ENCOUNTER — Other Ambulatory Visit: Payer: Self-pay | Admitting: Gastroenterology

## 2020-11-23 DIAGNOSIS — R1011 Right upper quadrant pain: Secondary | ICD-10-CM

## 2020-12-11 ENCOUNTER — Ambulatory Visit
Admission: RE | Admit: 2020-12-11 | Discharge: 2020-12-11 | Disposition: A | Discharge status: Home / Self Care | Payer: BC Managed Care – PPO | Source: Ambulatory Visit | Attending: Gastroenterology | Admitting: Gastroenterology

## 2020-12-11 DIAGNOSIS — R1011 Right upper quadrant pain: Secondary | ICD-10-CM

## 2021-01-09 ENCOUNTER — Ambulatory Visit
Admission: RE | Admit: 2021-01-09 | Discharge: 2021-01-09 | Disposition: A | Discharge status: Home / Self Care | Payer: BC Managed Care – PPO | Source: Ambulatory Visit | Attending: Family Medicine | Admitting: Family Medicine

## 2021-01-09 ENCOUNTER — Other Ambulatory Visit: Payer: Self-pay

## 2021-01-09 VITALS — BP 136/82 | HR 87 | Temp 97.9°F | Resp 16

## 2021-01-09 DIAGNOSIS — J0140 Acute pansinusitis, unspecified: Secondary | ICD-10-CM

## 2021-01-09 DIAGNOSIS — J069 Acute upper respiratory infection, unspecified: Secondary | ICD-10-CM

## 2021-01-09 DIAGNOSIS — J029 Acute pharyngitis, unspecified: Secondary | ICD-10-CM

## 2021-01-09 LAB — POCT RAPID STREP A (OFFICE): Rapid Strep A Screen: NEGATIVE

## 2021-01-09 MED ORDER — GUAIFENESIN-CODEINE 100-10 MG/5ML PO SYRP: 5.0000 mL | ORAL_SOLUTION | Freq: Three times a day (TID) | ORAL | 0 refills | Status: DC | PRN

## 2021-01-09 MED ORDER — AMOXICILLIN-POT CLAVULANATE 875-125 MG PO TABS: 1.0000 | ORAL_TABLET | Freq: Two times a day (BID) | ORAL | 0 refills | Status: AC

## 2021-01-09 NOTE — Discharge Instructions (Signed)
I have sent in Augmentin for you to take twice a day for 7 days.  I have sent in cough syrup for you to take. This medication can make you sleepy. Do not drive while taking this medication.  Your COVID and flu test is pending.  You should self quarantine until the test result is back.    Take Tylenol or ibuprofen as needed for fever or discomfort.  Rest and keep yourself hydrated.    Follow-up with your primary care provider if your symptoms are not improving.

## 2021-01-09 NOTE — ED Triage Notes (Signed)
Chills, sore throat, congestion since last night

## 2021-01-09 NOTE — ED Provider Notes (Signed)
[facility]   [id number] 01/09/21 Arrival Time: 0850   CC: COVID symptoms  SUBJECTIVE: History from: patient.  [name] is a 45 y.o. female who presents with nasal congestion, cough and sinus pressure for the last 8 days. Reports that sore throat started last night.  Denies sick exposure to COVID, flu or strep. Denies recent travel. Has negative history of Covid. Has completed Covid vaccines. Has completed flu vaccine. Has been taking mucinex with temporary relief. There are no aggravating or alleviating factors. Denies previous symptoms in the past. Denies fever, rhinorrhea, SOB, wheezing, chest pain, nausea, changes in bowel or bladder habits.    ROS: As per HPI.  All other pertinent ROS negative.     Past Medical History:  Diagnosis Date  . Allergic urticaria 12/19/2016   Past Surgical History:  Procedure Laterality Date  . APPENDECTOMY    . CESAREAN SECTION     X 2  . ENDOMETRIAL ABLATION    . TONSILLECTOMY    . TUBAL LIGATION     No Known Allergies No current facility-administered medications on file prior to encounter.   Current Outpatient Medications on File Prior to Encounter  Medication Sig Dispense Refill  . cyclobenzaprine (FLEXERIL) 10 MG tablet TK 1 T PO TID PRF  0  . fluticasone (FLONASE) 50 MCG/ACT nasal spray USE 2 SPRAYS IN EACH NOSTRIL DAILY FOR STUFFY NOSE OR DRAINAGE 16 g 5  . fluticasone (FLOVENT HFA) 220 MCG/ACT inhaler Inhale 1 puff into the lungs 2 (two) times daily as needed. Eosinophilic esophagitis    . FLUVIRIN SUSP Inject 0.5 mLs into the muscle as directed.  0  . levocetirizine (XYZAL) 5 MG tablet Take 1 tablet (5 mg total) by mouth every evening. 30 tablet 5  . Magnesium 250 MG TABS Take by mouth.    . meloxicam (MOBIC) 15 MG tablet TK 1 T PO D  10  . Multiple Vitamin (MULTIVITAMIN) capsule Take by mouth.    . Olopatadine HCl 0.2 % SOLN PLACE 1 DROP IN BOTH EYES ONCE EVERY DAY 2.5 mL 1  . omega-3 acid ethyl esters (LOVAZA) 1 g  capsule Take by mouth 2 (two) times daily.    . phentermine 30 MG capsule Take 30 mg by mouth.    . pravastatin (PRAVACHOL) 20 MG tablet      Social History   Socioeconomic History  . Marital status: Legally Separated    Spouse name: Not on file  . Number of children: Not on file  . Years of education: Not on file  . Highest education level: Not on file  Occupational History  . Not on file  Tobacco Use  . Smoking status: Never Smoker  . Smokeless tobacco: Never Used  Substance and Sexual Activity  . Alcohol use: No  . Drug use: No  . Sexual activity: Not on file  Other Topics Concern  . Not on file  Social History Narrative  . Not on file   Social Determinants of Health   Financial Resource Strain: Not on file  Food Insecurity: Not on file  Transportation Needs: Not on file  Physical Activity: Not on file  Stress: Not on file  Social Connections: Not on file  Intimate Partner Violence: Not on file   Family History  Problem Relation Age of Onset  . Allergic rhinitis Neg Hx   . Asthma Neg Hx   . Angioedema Neg Hx   . Eczema Neg Hx   . Immunodeficiency Neg Hx   .  Urticaria Neg Hx     OBJECTIVE:  Vitals:   01/09/21 0905  BP: 136/82  Pulse: 87  Resp: 16  Temp: 97.9 F (36.6 C)  TempSrc: Tympanic  SpO2: 96%     General appearance: alert; appears fatigued, but nontoxic; speaking in full sentences and tolerating own secretions HEENT: NCAT; Ears: EACs clear, TMs pearly gray; Eyes: PERRL.  EOM grossly intact. Sinuses: maxillary and frontal sinus tenderness; Nose: nares patent with clear rhinorrhea Throat: oropharynx erythematous, cobblestoning present, tonsils non erythematous or enlarged, uvula midline  Neck: supple without LAD Lungs: unlabored respirations, symmetrical air entry; cough: moderate; no respiratory distress; CTAB  Heart: regular rate and rhythm.  Radial pulses 2+ symmetrical bilaterally Skin: warm and dry Psychological: alert and cooperative;  normal mood and affect  LABS:  Results for orders placed or performed during the hospital encounter of 01/09/21 (from the past 24 hour(s))  POCT rapid strep A     Status: None   Collection Time: 01/09/21  9:17 AM  Result Value Ref Range   Rapid Strep A Screen Negative Negative     ASSESSMENT & PLAN:  1. Acute non-recurrent pansinusitis   2. Sore throat   3. URI with cough and congestion     Meds ordered this encounter  Medications  . amoxicillin-clavulanate (AUGMENTIN) 875-125 MG tablet    Sig: Take 1 tablet by mouth 2 (two) times daily for 7 days.    Dispense:  14 tablet    Refill:  0    Order Specific Question:   Supervising Provider    Answer:   Merrilee Jansky [id number]  . guaiFENesin-codeine (ROBITUSSIN AC) 100-10 MG/5ML syrup    Sig: Take 5 mLs by mouth 3 (three) times daily as needed for cough.    Dispense:  120 mL    Refill:  0    Order Specific Question:   Supervising Provider    Answer:   Merrilee Jansky [[id number]]   Will treat for sinusitis given length of symptoms and clinical presentation Prescribed Augmentin BID x 7 days Prescribed Cheratussin Sedation precautions given Continue supportive care at home COVID and flu testing ordered. It will take between 2-3 days for test results. Someone will contact you regarding abnormal results.   Work note provided Patient should remain in quarantine until they have received Covid results.  If negative you may resume normal activities (go back to work/school) while practicing hand hygiene, social distance, and mask wearing.  If positive, patient should remain in quarantine for at least 5 days from symptom onset AND greater than 72 hours after symptoms resolution (absence of fever without the use of fever-reducing medication and improvement in respiratory symptoms), whichever is longer Get plenty of rest and push fluids Use OTC zyrtec for nasal congestion, runny nose, and/or sore throat Use OTC flonase for nasal  congestion and runny nose Use medications daily for symptom relief Use OTC medications like ibuprofen or tylenol as needed fever or pain Call or go to the ED if you have any new or worsening symptoms such as fever, worsening cough, shortness of breath, chest tightness, chest pain, turning blue, changes in mental status.  Reviewed expectations re: course of current medical issues. Questions answered. Outlined signs and symptoms indicating need for more acute intervention. Patient verbalized understanding. After Visit Summary given.         Moshe Cipro, NP 01/09/21 1000

## 2021-01-10 LAB — COVID-19, FLU A+B NAA
Influenza A, NAA: NOT DETECTED
Influenza B, NAA: NOT DETECTED
SARS-CoV-2, NAA: NOT DETECTED

## 2021-01-12 LAB — CULTURE, GROUP A STREP (THRC)

## 2021-01-28 ENCOUNTER — Other Ambulatory Visit: Payer: Self-pay | Admitting: Obstetrics and Gynecology

## 2021-01-28 DIAGNOSIS — Z1231 Encounter for screening mammogram for malignant neoplasm of breast: Secondary | ICD-10-CM

## 2021-03-22 ENCOUNTER — Ambulatory Visit
Admission: RE | Admit: 2021-03-22 | Discharge: 2021-03-22 | Disposition: A | Discharge status: Home / Self Care | Payer: BC Managed Care – PPO | Source: Ambulatory Visit | Attending: Obstetrics and Gynecology | Admitting: Obstetrics and Gynecology

## 2021-03-22 ENCOUNTER — Other Ambulatory Visit: Payer: Self-pay

## 2021-03-22 DIAGNOSIS — Z1231 Encounter for screening mammogram for malignant neoplasm of breast: Secondary | ICD-10-CM

## 2021-04-13 ENCOUNTER — Other Ambulatory Visit: Payer: Self-pay

## 2021-04-13 ENCOUNTER — Ambulatory Visit
Admission: RE | Admit: 2021-04-13 | Discharge: 2021-04-13 | Disposition: A | Discharge status: Home / Self Care | Payer: BC Managed Care – PPO | Source: Ambulatory Visit | Attending: Family Medicine | Admitting: Family Medicine

## 2021-04-13 VITALS — BP 118/72 | HR 83 | Temp 99.4°F | Resp 17

## 2021-04-13 DIAGNOSIS — R110 Nausea: Secondary | ICD-10-CM

## 2021-04-13 DIAGNOSIS — B349 Viral infection, unspecified: Secondary | ICD-10-CM

## 2021-04-13 DIAGNOSIS — R197 Diarrhea, unspecified: Secondary | ICD-10-CM

## 2021-04-13 MED ORDER — LEVOCETIRIZINE DIHYDROCHLORIDE 5 MG PO TABS: 5.0000 mg | ORAL_TABLET | Freq: Every evening | ORAL | 2 refills | Status: AC

## 2021-04-13 MED ORDER — ONDANSETRON HCL 4 MG PO TABS: 4.0000 mg | ORAL_TABLET | Freq: Four times a day (QID) | ORAL | 0 refills | Status: DC

## 2021-04-13 MED ORDER — GUAIFENESIN-CODEINE 100-10 MG/5ML PO SYRP: 5.0000 mL | ORAL_SOLUTION | Freq: Three times a day (TID) | ORAL | 0 refills | Status: AC | PRN

## 2021-04-13 NOTE — ED Provider Notes (Signed)
[facility]    CSN: [id number] Arrival date & time: 04/13/21  1141      History   Chief Complaint Chief Complaint  Patient presents with  . Nasal Congestion    HPI [name] is a 45 y.o. female.   Reports nasal congestion and fever that began yesterday.  Also reports recent nausea as well as diarrhea.  Has taken ibuprofen and Tylenol for fever with relief.  Denies sick contacts.  Has positive history of COVID.  Has not completed flu vaccines.  Has not completed COVID-vaccines.  Requesting prescription for Xyzal, states this is covered by her insurance and cheaper this way.  Denies abdominal pain, vomiting,  rash, other symptoms.  ROS per HPI  The history is provided by the patient.    Past Medical History:  Diagnosis Date  . Allergic urticaria 12/19/2016    Patient Active Problem List   Diagnosis Date Noted  . Allergic urticaria 12/19/2016  . Perennial and seasonal allergic rhinitis 06/16/2016  . Eosinophilic esophagitis 06/16/2016  . Seasonal allergic conjunctivitis 06/16/2016    Past Surgical History:  Procedure Laterality Date  . APPENDECTOMY    . CESAREAN SECTION     X 2  . ENDOMETRIAL ABLATION    . TONSILLECTOMY    . TUBAL LIGATION      OB History   No obstetric history on file.      Home Medications    Prior to Admission medications   Medication Sig Start Date End Date Taking? Authorizing Provider  guaiFENesin-codeine (ROBITUSSIN AC) 100-10 MG/5ML syrup Take 5 mLs by mouth 3 (three) times daily as needed for cough. 04/13/21  Yes Moshe Cipro, NP  levocetirizine (XYZAL) 5 MG tablet Take 1 tablet (5 mg total) by mouth every evening. 04/13/21  Yes Moshe Cipro, NP  ondansetron (ZOFRAN) 4 MG tablet Take 1 tablet (4 mg total) by mouth every 6 (six) hours. 04/13/21  Yes Moshe Cipro, NP  cyclobenzaprine (FLEXERIL) 10 MG tablet TK 1 T PO TID PRF 06/06/16   [provider]  fluticasone (FLONASE) 50 MCG/ACT nasal  spray USE 2 SPRAYS IN EACH NOSTRIL DAILY FOR STUFFY NOSE OR DRAINAGE 06/05/17   Bobbitt, Heywood Iles, MD  fluticasone (FLOVENT HFA) 220 MCG/ACT inhaler Inhale 1 puff into the lungs 2 (two) times daily as needed. 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 09/19/16   [provider]  Magnesium 250 MG TABS Take by mouth.    [provider]  meloxicam (MOBIC) 15 MG tablet TK 1 T PO D 04/14/16   [provider]  Multiple Vitamin (MULTIVITAMIN) capsule Take by mouth.    [provider]  Olopatadine HCl 0.2 % SOLN PLACE 1 DROP IN BOTH EYES ONCE EVERY DAY 12/25/17   Bobbitt, Heywood Iles, MD  omega-3 acid ethyl esters (LOVAZA) 1 g capsule Take by mouth 2 (two) times daily.    [provider]  phentermine 30 MG capsule Take 30 mg by mouth. 05/23/16 06/22/16  [provider]  pravastatin (PRAVACHOL) 20 MG tablet  12/01/15   [provider]    Family History Family History  Problem Relation Age of Onset  . Allergic rhinitis Neg Hx   . Asthma Neg Hx   . Angioedema Neg Hx   . Eczema Neg Hx   . Immunodeficiency Neg Hx   . Urticaria Neg Hx     Social History Social History  Tobacco Use  . Smoking status: Never Smoker  . Smokeless tobacco: Never Used  Substance Use Topics  . Alcohol use: No  . Drug use: No     Allergies   Patient has no known allergies.   Review of Systems Review of Systems   Physical Exam Triage Vital Signs ED Triage Vitals  Enc Vitals Group     BP 04/13/21 1326 118/72     Pulse Rate 04/13/21 1326 83     Resp 04/13/21 1326 17     Temp 04/13/21 1326 99.4 F (37.4 C)     Temp Source 04/13/21 1326 Oral     SpO2 04/13/21 1326 98 %     Weight --      Height --      Head Circumference --      Peak Flow --      Pain Score 04/13/21 1332 4     Pain Loc --      Pain Edu? --      Excl. in GC? --    No data found.  Updated Vital  Signs BP 118/72 (BP Location: Right Arm)   Pulse 83   Temp 99.4 F (37.4 C) (Oral)   Resp 17   SpO2 98%   Visual Acuity Right Eye Distance:   Left Eye Distance:   Bilateral Distance:    Right Eye Near:   Left Eye Near:    Bilateral Near:     Physical Exam Vitals and nursing note reviewed.  Constitutional:      General: She is not in acute distress.    Appearance: She is well-developed. She is ill-appearing.  HENT:     Head: Normocephalic and atraumatic.     Right Ear: Tympanic membrane, ear canal and external ear normal.     Left Ear: Tympanic membrane, ear canal and external ear normal.     Nose: Congestion present.     Mouth/Throat:     Mouth: Mucous membranes are moist.     Pharynx: Posterior oropharyngeal erythema present.  Eyes:     Extraocular Movements: Extraocular movements intact.     Conjunctiva/sclera: Conjunctivae normal.     Pupils: Pupils are equal, round, and reactive to light.  Cardiovascular:     Rate and Rhythm: Normal rate and regular rhythm.     Heart sounds: Normal heart sounds. No murmur heard.   Pulmonary:     Effort: Pulmonary effort is normal. No respiratory distress.     Breath sounds: Normal breath sounds. No stridor. No wheezing, rhonchi or rales.     Comments: Moderate dry cough present Chest:     Chest wall: No tenderness.  Abdominal:     General: Bowel sounds are normal. There is no distension.     Palpations: Abdomen is soft. There is no mass.     Tenderness: There is no abdominal tenderness. There is no right CVA tenderness, left CVA tenderness, guarding or rebound.     Hernia: No hernia is present.  Musculoskeletal:        General: Normal range of motion.     Cervical back: Normal range of motion and neck supple.  Lymphadenopathy:     Cervical: Cervical adenopathy present.  Skin:    General: Skin is warm and dry.     Capillary Refill: Capillary refill takes less than 2 seconds.  Neurological:     General: No focal deficit  present.     Mental Status: She is alert and oriented to  person, place, and time.  Psychiatric:        Mood and Affect: Mood normal.        Behavior: Behavior normal.        Thought Content: Thought content normal.      UC Treatments / Results  Labs (all labs ordered are listed, but only abnormal results are displayed) Labs Reviewed  COVID-19, FLU A+B NAA - Abnormal; Notable for the following components:      Result Value   SARS-CoV-2, NAA Detected (*)    All other components within normal limits   Narrative:    Test(s) 140142-Influenza A, NAA; 140143-Influenza B, NAA was developed and its performance characteristics determined by [facility]. It has not been cleared or approved by the Food and Drug Administration. Performed at:  [address], [place], [place]  [id number] Lab Director: Jolene Schimke MD, Phone:  [phone]    EKG   Radiology No results found.  Procedures Procedures (including critical care time)  Medications Ordered in UC Medications - No data to display  Initial Impression / Assessment and Plan / UC Course  I have reviewed the triage vital signs and the nursing notes.  Pertinent labs & imaging results that were available during my care of the patient were reviewed by me and considered in my medical decision making (see chart for details).    Viral illness Nausea without vomiting Diarrhea  May use Imodium over-the-counter as needed for diarrhea Prescribe Zofran as needed for nausea Prescribe Xyzal Cheratussin cough syrup prescribed Sedation precautions given Work note provided Covid and flu swab obtained in office today.   Patient instructed to quarantine until results are back and negative.   If results are negative, patient may resume daily schedule as tolerated once they are fever free for 24 hours without the use of antipyretic medications.   If results are positive, patient instructed to quarantine for at least 5  days from symptom onset.  If after 5 days symptoms have resolved, may return to work with a well fitting mask for the next 5 days. If symptomatic after day 5, isolation should be extended to 10 days. Patient instructed to follow-up with primary care or with this office as needed.   Patient instructed to follow-up in the ER for trouble swallowing, trouble breathing, other concerning symptoms.    Final Clinical Impressions(s) / UC Diagnoses   Final diagnoses:  Viral illness  Nausea without vomiting  Diarrhea, unspecified type     Discharge Instructions     I have sent in Zofran for you to take one tablet every 8 hours as needed for nausea.  I have sent in cough syrup for you to take. This medication can make you sleepy. Do not drive while taking this medication.  Your COVID and Influenza tests are pending.  You should self quarantine until the test results are back.    Take Tylenol or ibuprofen as needed for fever or discomfort.  Rest and keep yourself hydrated.    Follow-up with your primary care provider if your symptoms are not improving.        ED Prescriptions    Medication Sig Dispense Auth. Provider   ondansetron (ZOFRAN) 4 MG tablet Take 1 tablet (4 mg total) by mouth every 6 (six) hours. 12 tablet Moshe Cipro, NP   levocetirizine (XYZAL) 5 MG tablet Take 1 tablet (5 mg total) by mouth every evening. 30 tablet Moshe Cipro, NP   guaiFENesin-codeine (ROBITUSSIN AC) 100-10 MG/5ML  syrup Take 5 mLs by mouth 3 (three) times daily as needed for cough. 120 mL Moshe Cipro, NP     PDMP not reviewed this encounter.   Moshe Cipro, NP 04/17/21 1805

## 2021-04-13 NOTE — ED Triage Notes (Signed)
Pt presents with nasal congestion and fever that began yesterday

## 2021-04-13 NOTE — Discharge Instructions (Signed)
I have sent in Zofran for you to take one tablet every 8 hours as needed for nausea.  I have sent in cough syrup for you to take. This medication can make you sleepy. Do not drive while taking this medication.  Your COVID and Influenza tests are pending.  You should self quarantine until the test results are back.    Take Tylenol or ibuprofen as needed for fever or discomfort.  Rest and keep yourself hydrated.    Follow-up with your primary care provider if your symptoms are not improving.

## 2021-04-14 LAB — COVID-19, FLU A+B NAA
Influenza A, NAA: NOT DETECTED
Influenza B, NAA: NOT DETECTED
SARS-CoV-2, NAA: DETECTED — AB

## 2021-09-03 ENCOUNTER — Emergency Department (HOSPITAL_COMMUNITY): Payer: BC Managed Care – PPO

## 2021-09-03 ENCOUNTER — Encounter (HOSPITAL_COMMUNITY): Payer: Self-pay

## 2021-09-03 ENCOUNTER — Other Ambulatory Visit: Payer: Self-pay

## 2021-09-03 ENCOUNTER — Emergency Department (HOSPITAL_COMMUNITY)
Admission: EM | Admit: 2021-09-03 | Discharge: 2021-09-04 | Disposition: A | Discharge status: Home / Self Care | Payer: BC Managed Care – PPO | Attending: Emergency Medicine | Admitting: Emergency Medicine

## 2021-09-03 DIAGNOSIS — Z23 Encounter for immunization: Secondary | ICD-10-CM | POA: Insufficient documentation

## 2021-09-03 DIAGNOSIS — W01198A Fall on same level from slipping, tripping and stumbling with subsequent striking against other object, initial encounter: Secondary | ICD-10-CM | POA: Insufficient documentation

## 2021-09-03 DIAGNOSIS — R112 Nausea with vomiting, unspecified: Secondary | ICD-10-CM | POA: Diagnosis not present

## 2021-09-03 DIAGNOSIS — S0990XA Unspecified injury of head, initial encounter: Secondary | ICD-10-CM

## 2021-09-03 DIAGNOSIS — Y92000 Kitchen of unspecified non-institutional (private) residence as  the place of occurrence of the external cause: Secondary | ICD-10-CM | POA: Insufficient documentation

## 2021-09-03 DIAGNOSIS — S0101XA Laceration without foreign body of scalp, initial encounter: Secondary | ICD-10-CM | POA: Insufficient documentation

## 2021-09-03 LAB — CBC WITH DIFFERENTIAL/PLATELET
Abs Immature Granulocytes: 0.03 10*3/uL (ref 0.00–0.07)
Basophils Absolute: 0.1 10*3/uL (ref 0.0–0.1)
Basophils Relative: 1 %
Eosinophils Absolute: 0.4 10*3/uL (ref 0.0–0.5)
Eosinophils Relative: 4 %
HCT: 41.8 % (ref 36.0–46.0)
Hemoglobin: 14.1 g/dL (ref 12.0–15.0)
Immature Granulocytes: 0 %
Lymphocytes Relative: 46 %
Lymphs Abs: 4.3 10*3/uL — ABNORMAL HIGH (ref 0.7–4.0)
MCH: 30.9 pg (ref 26.0–34.0)
MCHC: 33.7 g/dL (ref 30.0–36.0)
MCV: 91.5 fL (ref 80.0–100.0)
Monocytes Absolute: 0.8 10*3/uL (ref 0.1–1.0)
Monocytes Relative: 9 %
Neutro Abs: 3.7 10*3/uL (ref 1.7–7.7)
Neutrophils Relative %: 40 %
Platelets: 273 10*3/uL (ref 150–400)
RBC: 4.57 MIL/uL (ref 3.87–5.11)
RDW: 13.2 % (ref 11.5–15.5)
WBC: 9.4 10*3/uL (ref 4.0–10.5)
nRBC: 0 % (ref 0.0–0.2)

## 2021-09-03 LAB — COMPREHENSIVE METABOLIC PANEL
ALT: 19 U/L (ref 0–44)
AST: 16 U/L (ref 15–41)
Albumin: 4 g/dL (ref 3.5–5.0)
Alkaline Phosphatase: 68 U/L (ref 38–126)
Anion gap: 9 (ref 5–15)
BUN: 9 mg/dL (ref 6–20)
CO2: 22 mmol/L (ref 22–32)
Calcium: 8.6 mg/dL — ABNORMAL LOW (ref 8.9–10.3)
Chloride: 108 mmol/L (ref 98–111)
Creatinine, Ser: 0.63 mg/dL (ref 0.44–1.00)
GFR, Estimated: >60 mL/min (ref >60)
Glucose, Bld: 138 mg/dL — ABNORMAL HIGH (ref 70–99)
Potassium: 3.2 mmol/L — ABNORMAL LOW (ref 3.5–5.1)
Sodium: 139 mmol/L (ref 135–145)
Total Bilirubin: 0.7 mg/dL (ref 0.3–1.2)
Total Protein: 7.1 g/dL (ref 6.5–8.1)

## 2021-09-03 MED ORDER — LIDOCAINE-EPINEPHRINE (PF) 2 %-1:200000 IJ SOLN
INTRAMUSCULAR | Status: AC
  Administered 2021-09-03: 20 mL via INTRADERMAL
  Filled 2021-09-03: qty 20

## 2021-09-03 MED ORDER — ONDANSETRON 4 MG PO TBDP: 4.0000 mg | ORAL_TABLET | Freq: Four times a day (QID) | ORAL | 0 refills | Status: AC | PRN

## 2021-09-03 MED ORDER — ONDANSETRON 8 MG PO TBDP
8.0000 mg | ORAL_TABLET | Freq: Once | ORAL | Status: AC
  Administered 2021-09-03: 8 mg via ORAL
  Filled 2021-09-03: qty 1

## 2021-09-03 MED ORDER — LIDOCAINE-EPINEPHRINE 2 %-1:100000 IJ SOLN
20.0000 mL | Freq: Once | INTRAMUSCULAR | Status: DC
  Filled 2021-09-03: qty 20

## 2021-09-03 MED ORDER — TETANUS-DIPHTH-ACELL PERTUSSIS 5-2.5-18.5 LF-MCG/0.5 IM SUSY
0.5000 mL | PREFILLED_SYRINGE | Freq: Once | INTRAMUSCULAR | Status: AC
  Administered 2021-09-04: 0.5 mL via INTRAMUSCULAR
  Filled 2021-09-03: qty 0.5

## 2021-09-03 MED ORDER — LIDOCAINE-EPINEPHRINE (PF) 2 %-1:200000 IJ SOLN: 20.0000 mL | Freq: Once | INTRAMUSCULAR | Status: AC

## 2021-09-03 MED ORDER — ONDANSETRON 4 MG PO TBDP: ORAL_TABLET | ORAL | 0 refills | Status: AC

## 2021-09-03 NOTE — ED Triage Notes (Signed)
Pt. Stats they tripped on their kitchen floor and hit the back of their head on the kitchen table. Pt. Has 2 in laceration on the back of their head. Bleeding is controlled at this time. Pt. Denis LOC. Pt. Is not on any blood thinners. Pt. States they feel nausous. Pt. Has had x2 vomiting in triage.

## 2021-09-04 NOTE — ED Provider Notes (Signed)
[facility] EMERGENCY DEPARTMENT Provider Note   CSN: [id number] Arrival date & time: 09/03/21  2157     History Chief Complaint  Patient presents with   Head Laceration    [name] is a 45 y.o. female.  45 yo w/ a fall and head laceration.  Patient was having some alcohol but states that she has been throwing up as well.  She went to go throw up but she got lightheaded and fell.  She did not syncopized.  She hit the back of her head.  She states she had really significant pain however she had some bleeding in her children were worried so brought here for further evaluation.  She did vomit a couple times in the waiting room.  No abdominal pain.  No diarrhea.  No fevers.  No other associated symptoms.  No neurologic changes or vision changes since the head injury.   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     Family History  Problem Relation Age of Onset   Allergic rhinitis Neg Hx    Asthma Neg Hx    Angioedema Neg Hx    Eczema Neg Hx    Immunodeficiency Neg Hx    Urticaria Neg Hx     Social History   Tobacco Use   Smoking status: Never   Smokeless tobacco: Never  Substance Use Topics   Alcohol use: No   Drug use: No    Home Medications Prior to Admission medications   Medication Sig Start Date End Date Taking? Authorizing Provider  ondansetron (ZOFRAN ODT) 4 MG disintegrating tablet 4mg  ODT q4 hours prn nausea/vomit 09/03/21  Yes Maimouna Rondeau, 09/05/21, MD  ondansetron  (ZOFRAN ODT) 4 MG disintegrating tablet Take 1 tablet (4 mg total) by mouth every 6 (six) hours as needed for vomiting. 09/03/21  Yes Daylan Boggess, 09/05/21, MD  cyclobenzaprine (FLEXERIL) 10 MG tablet TK 1 T PO TID PRF 06/06/16   [provider]  fluticasone (FLONASE) 50 MCG/ACT nasal spray USE 2 SPRAYS IN EACH NOSTRIL DAILY FOR STUFFY NOSE OR DRAINAGE 06/05/17   Bobbitt, 08/06/17, MD  fluticasone (FLOVENT HFA) 220 MCG/ACT inhaler Inhale 1 puff into the lungs 2 (two) times daily as needed. 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 09/19/16   [provider]  guaiFENesin-codeine (ROBITUSSIN AC) 100-10 MG/5ML syrup Take 5 mLs by mouth 3 (three) times daily as needed for cough. 04/13/21   06/13/21, NP  levocetirizine (XYZAL) 5 MG tablet Take 1 tablet (5 mg total) by mouth every evening. 04/13/21   06/13/21, NP  Magnesium 250 MG TABS Take by mouth.    [provider]  meloxicam (MOBIC) 15 MG tablet TK 1 T PO D 04/14/16   [provider]  Multiple Vitamin (MULTIVITAMIN) capsule Take by mouth.    [provider]  Olopatadine HCl 0.2 % SOLN PLACE 1 DROP IN BOTH EYES ONCE EVERY DAY 12/25/17   Bobbitt, Heywood Iles, MD  omega-3 acid ethyl esters (LOVAZA) 1 g capsule Take by mouth 2 (two) times daily.    [provider]  phentermine 30 MG capsule Take 30 mg by mouth. 05/23/16 06/22/16  [provider]  pravastatin (PRAVACHOL) 20 MG tablet  12/01/15   [provider]    Allergies    Patient has no known allergies.  Review of Systems   Review of Systems  All other systems reviewed and are negative.  Physical Exam Updated Vital Signs BP (!) 142/82 (BP Location: Left Arm)   Pulse 87   Temp 98.2 F (36.8 C)   Resp 18   Ht 5\' 4"  (1.626 m)   Wt 103 kg   SpO2 100%   BMI 38.96 kg/m   Physical Exam Vitals and nursing note reviewed.   Constitutional:      Appearance: She is well-developed.  HENT:     Head: Normocephalic.     Comments: 4 cm laceration posterior scalp hemostatic and well approximated.    Mouth/Throat:     Mouth: Mucous membranes are moist.     Pharynx: Oropharynx is clear.  Eyes:     Pupils: Pupils are equal, round, and reactive to light.  Cardiovascular:     Rate and Rhythm: Normal rate and regular rhythm.  Pulmonary:     Effort: Pulmonary effort is normal. No respiratory distress.     Breath sounds: No stridor.  Abdominal:     General: Abdomen is flat. There is no distension.  Musculoskeletal:        General: No swelling or tenderness. Normal range of motion.     Cervical back: Normal range of motion.  Skin:    General: Skin is warm and dry.  Neurological:     General: No focal deficit present.     Mental Status: She is alert.    ED Results / Procedures / Treatments   Labs (all labs ordered are listed, but only abnormal results are displayed) Labs Reviewed  CBC WITH DIFFERENTIAL/PLATELET - Abnormal; Notable for the following components:      Result Value   Lymphs Abs 4.3 (*)    All other components within normal limits  COMPREHENSIVE METABOLIC PANEL - Abnormal; Notable for the following components:   Potassium 3.2 (*)    Glucose, Bld 138 (*)    Calcium 8.6 (*)    All other components within normal limits    EKG None  Radiology CT Head Wo Contrast  Result Date: 09/03/2021 CLINICAL DATA:  Fall. EXAM: CT HEAD WITHOUT CONTRAST TECHNIQUE: Contiguous axial images were obtained from the base of the skull through the vertex without intravenous contrast. COMPARISON:  None. FINDINGS: Brain: No evidence of acute infarction, hemorrhage, hydrocephalus, extra-axial collection or mass lesion/mass effect. Vascular: No hyperdense vessel or unexpected calcification. Skull: Normal. Negative for fracture or focal lesion. Sinuses/Orbits: No acute finding. Other: There is mild posterior right parietal  scalp soft tissue swelling. IMPRESSION: No acute intracranial abnormality. Electronically Signed   By: 12-31-1970 M.D.   On: 09/03/2021 23:14    Procedures .10/02/2022Laceration Repair  Date/Time: 09/04/2021 12:07 AM Performed by: 11/04/2021, MD Authorized by: Marily Memos, MD   Consent:    Consent obtained:  Verbal   Consent given by:  Patient   Risks discussed:  Infection, need for additional repair, nerve damage, poor wound healing, poor cosmetic result,  pain, retained foreign body, tendon damage and vascular damage   Alternatives discussed:  No treatment, delayed treatment and observation Universal protocol:    Procedure explained and questions answered to patient or proxy's satisfaction: yes     Relevant documents present and verified: yes     Imaging studies available: yes     Site/side marked: yes     Patient identity confirmed:  Hospital-assigned identification number and arm band Anesthesia:    Anesthesia method:  Local infiltration   Local anesthetic:  Lidocaine 1% WITH epi Laceration details:    Location:  Scalp   Scalp location:  Occipital   Length (cm):  4   Depth (mm):  6 Pre-procedure details:    Preparation:  Patient was prepped and draped in usual sterile fashion and imaging obtained to evaluate for foreign bodies Exploration:    Wound exploration: wound explored through full range of motion   Treatment:    Area cleansed with:  Saline   Amount of cleaning:  Extensive   Irrigation solution:  Sterile water   Irrigation method:  Syringe   Debridement:  None   Undermining:  None Skin repair:    Repair method:  Staples   Number of staples:  10 Approximation:    Approximation:  Close Repair type:    Repair type:  Simple Post-procedure details:    Dressing:  Open (no dressing)   Procedure completion:  Tolerated well, no immediate complications   Medications Ordered in ED Medications  Tdap (BOOSTRIX) injection 0.5 mL (has no administration in time range)   ondansetron (ZOFRAN-ODT) disintegrating tablet 8 mg (8 mg Oral Given 09/03/21 2310)  lidocaine-EPINEPHrine (XYLOCAINE W/EPI) 2 %-1:200000 (PF) injection 20 mL (20 mLs Intradermal Given by Other 09/03/21 2355)    ED Course  I have reviewed the triage vital signs and the nursing notes.  Pertinent labs & imaging results that were available during my care of the patient were reviewed by me and considered in my medical decision making (see chart for details).    MDM Rules/Calculators/A&P                         Wound stapled as above.  She had vomiting before she fell but she also had a couple episodes after she fell so CT was done to make sure there were no intracranial injuries and this was fine.  Tdap will be updated.  Patient will follow-up with her PCP for staple removal or return here for any new or worsening symptoms.  Tolerate p.o. prior to discharge   Final Clinical Impression(s) / ED Diagnoses Final diagnoses:  Laceration of scalp, initial encounter  Non-intractable vomiting with nausea, unspecified vomiting type  Injury of head, initial encounter    Rx / DC Orders ED Discharge Orders          Ordered    ondansetron (ZOFRAN ODT) 4 MG disintegrating tablet        09/03/21 2354    ondansetron (ZOFRAN ODT) 4 MG disintegrating tablet  Every 6 hours PRN        09/03/21 2355             Quenisha Lovins, Barbara Cower, MD 09/04/21 0008

## 2022-02-11 ENCOUNTER — Ambulatory Visit
Admission: RE | Admit: 2022-02-11 | Discharge: 2022-02-11 | Disposition: A | Discharge status: Home / Self Care | Payer: BC Managed Care – PPO | Source: Ambulatory Visit

## 2022-02-11 ENCOUNTER — Other Ambulatory Visit: Payer: Self-pay

## 2022-02-11 VITALS — BP 122/78 | HR 77 | Temp 99.5°F | Resp 16

## 2022-02-11 DIAGNOSIS — J069 Acute upper respiratory infection, unspecified: Secondary | ICD-10-CM

## 2022-02-11 DIAGNOSIS — J3089 Other allergic rhinitis: Secondary | ICD-10-CM | POA: Diagnosis not present

## 2022-02-11 DIAGNOSIS — Z20828 Contact with and (suspected) exposure to other viral communicable diseases: Secondary | ICD-10-CM

## 2022-02-11 MED ORDER — AMOXICILLIN-POT CLAVULANATE 875-125 MG PO TABS: 1.0000 | ORAL_TABLET | Freq: Two times a day (BID) | ORAL | 0 refills | Status: AC

## 2022-02-11 MED ORDER — PROMETHAZINE-DM 6.25-15 MG/5ML PO SYRP: 5.0000 mL | ORAL_SOLUTION | Freq: Four times a day (QID) | ORAL | 0 refills | Status: AC | PRN

## 2022-02-11 NOTE — ED Provider Notes (Signed)
?[facility] ? ? ? ?CSN: [id number] ?Arrival date & time: 02/11/22  1344 ? ? ?  ? ?History   ?Chief Complaint ?Chief Complaint  ?Patient presents with  ? Cough  ?  Sore throat and cough  ? ? ?HPI ?[name] is a 46 y.o. female.  ? ?Presenting today with 3-week history of sore throat, congestion, sinus pressure, hacking productive cough, chest soreness from coughing.  She states that symptoms have been progressively worsening since onset.  Has tried Mucinex with no relief and is consistent on antihistamines for seasonal allergies.  Denies fever, chills, body aches, chest pain, shortness of breath, abdominal pain, nausea vomiting or diarrhea.  Tested for COVID and flu 2 weeks ago and states both were negative. ? ? ?Past Medical History:  ?Diagnosis Date  ? 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 ? ?Patient Active Problem List  ? Diagnosis Date Noted  ? 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 Perennial and seasonal allergic rhinitis 06/16/2016  ? Eosinophilic esophagitis [id number]  ? Seasonal allergic conjunctivitis 06/16/2016  ? ? ?Past Surgical History:  ?Procedure Laterality Date  ? APPENDECTOMY    ? CESAREAN SECTION    ? X 2  ? ENDOMETRIAL ABLATION    ? TONSILLECTOMY    ? TUBAL LIGATION    ? ? ?OB History   ?No obstetric history on file. ?  ? ? ? ?Home Medications   ? ?Prior to Admission medications   ?Medication Sig Start Date End Date Taking? Authorizing Provider  ?amoxicillin-clavulanate (AUGMENTIN) 875-125 MG tablet Take 1 tablet by mouth every 12 (twelve) hours. 02/11/22  Yes Volney American, PA-C  ?promethazine-dextromethorphan (PROMETHAZINE-DM) 6.25-15 MG/5ML syrup Take 5 mLs by mouth 4 (four) times daily as needed. 02/11/22  Yes Volney American, PA-C  ?cyclobenzaprine (FLEXERIL) 10 MG tablet TK 1 T PO TID PRF 06/06/16   [provider]  ?fluticasone (FLONASE) 50 MCG/ACT nasal spray USE 2 SPRAYS IN EACH NOSTRIL DAILY FOR STUFFY NOSE OR DRAINAGE 06/05/17   Bobbitt, Sedalia Muta, MD   ?fluticasone (FLOVENT HFA) 220 MCG/ACT inhaler Inhale 1 puff into the lungs 2 (two) times daily as needed. Eosinophilic esophagitis [id number]   [provider]  ?Payton Mccallum Inject 0.5 mLs into the muscle as directed. 09/19/16   [provider]  ?guaiFENesin-codeine (ROBITUSSIN AC) 100-10 MG/5ML syrup Take 5 mLs by mouth 3 (three) times daily as needed for cough. 04/13/21   Faustino Congress, NP  ?levocetirizine (XYZAL) 5 MG tablet Take 1 tablet (5 mg total) by mouth every evening. 04/13/21   Faustino Congress, NP  ?levothyroxine (SYNTHROID) 25 MCG tablet Take 25 mcg by mouth daily. 02/08/22   [provider]  ?Magnesium 250 MG TABS Take by mouth.    [provider]  ?meloxicam (MOBIC) 15 MG tablet TK 1 T PO D 04/14/16   [provider]  ?Multiple Vitamin (MULTIVITAMIN) capsule Take by mouth.    [provider]  ?Olopatadine HCl 0.2 % SOLN PLACE 1 DROP IN BOTH EYES ONCE EVERY DAY 12/25/17   Bobbitt, Sed[place] Muta, MD  ?omega-3 acid ethyl esters (LOVAZA) 1 g capsule Take by mouth 2 (two) times daily.    [provider]  ?ondansetron (ZOFRAN ODT) 4 MG disintegrating tablet 4mg  ODT q4 hours prn nausea/vomit 09/03/21   Mesner, Corene Cornea, MD  ?ondansetron (ZOFRAN ODT) 4 MG disintegrating tablet Take 1 tablet (4 mg total) by mouth every 6 (six) hours as needed for vomiting. 09/03/21   Mesner, Corene Cornea, MD  ?Heide Spark  Shell Calcium 500 MG TABS 1 tablet    [provider]  ?pantoprazole (PROTONIX) 40 MG tablet Take 40 mg by mouth daily. 11/22/21   [provider]  ?phentermine 30 MG capsule Take 30 mg by mouth. 05/23/16 06/22/16  [provider]  ?pravastatin (PRAVACHOL) 20 MG tablet  12/01/15   [provider]  ? ? ?Family History ?Family History  ?Problem Relation Age of Onset  ? Allergic rhinitis Neg Hx   ? Asthma Neg Hx   ? Angioedema Neg Hx   ? Eczema Neg Hx   ? Immunodeficiency Neg Hx   ? Urticaria Neg Hx   ? ? ?Social History ?Social  History  ? ?Tobacco Use  ? Smoking status: Never  ? Smokeless tobacco: Never  ?Vaping Use  ? Vaping Use: Never used  ?Substance Use Topics  ? Alcohol use: Yes  ?  Comment: [place]  ? Drug use: No  ? ? ? ?Allergies   ?Patient has no known allergies. ? ? ?Review of Systems ?Review of Systems ?Per HPI ? ?Physical Exam ?Triage Vital Signs ?ED Triage Vitals  ?Enc Vitals Group  ?   BP 02/11/22 1410 122/78  ?   Pulse Rate 02/11/22 1410 77  ?   Resp 02/11/22 1410 16  ?   Temp 02/11/22 1410 99.5 ?F (37.5 ?C)  ?   Temp Source 02/11/22 1410 Oral  ?   SpO2 02/11/22 1410 98 %  ?   Weight --   ?   Height --   ?   Head Circumference --   ?   Peak Flow --   ?   Pain Score 02/11/22 1406 4  ?   Pain Loc --   ?   Pain Edu? --   ?   Excl. in [place]? --   ? ?No data found. ? ?Updated Vital Signs ?BP 122/78 (BP Location: Right Arm)   Pulse 77   Temp 99.5 ?F (37.5 ?C) (Oral)   Resp 16   SpO2 98%  ? ?Visual Acuity ?Right Eye Distance:   ?Left Eye Distance:   ?Bilateral Distance:   ? ?Right Eye Near:   ?Left Eye Near:    ?Bilateral Near:    ? ?Physical Exam ?Vitals and nursing note reviewed.  ?Constitutional:   ?   Appearance: Normal appearance. She is not ill-appearing.  ?HENT:  ?   Head: Atraumatic.  ?   Right Ear: Tympanic membrane normal.  ?   Left Ear: Tympanic membrane normal.  ?   Nose: Congestion present.  ?   Mouth/Throat:  ?   Mouth: Mucous membranes are moist.  ?   Pharynx: Oropharynx is clear. Posterior oropharyngeal erythema present. No oropharyngeal exudate.  ?Eyes:  ?   Extraocular Movements: Extraocular movements intact.  ?   Conjunctiva/sclera: Conjunctivae normal.  ?Cardiovascular:  ?   Rate and Rhythm: Normal rate and regular rhythm.  ?   Heart sounds: Normal heart sounds.  ?Pulmonary:  ?   Effort: Pulmonary effort is normal.  ?   Breath sounds: Normal breath sounds. No wheezing or rales.  ?Musculoskeletal:     ?   General: Normal range of motion.  ?   Cervical back: Normal range of motion and neck supple.  ?Skin: ?    General: Skin is warm and dry.  ?Neurological:  ?   Mental Status: She is alert and oriented to person, place, and time.  ?Psychiatric:     ?   Mood and  Affect: Mood normal.     ?   Thought Content: Thought content normal.     ?   Judgment: Judgment normal.  ? ?UC Treatments / Results  ?Labs ?(all labs ordered are listed, but only abnormal results are displayed) ?Labs Reviewed  ?COVID-19, FLU A+B NAA  ? ?EKG ? ? ?Radiology ?No results found. ? ?Procedures ?Procedures (including critical care time) ? ?Medications Ordered in UC ?Medications - No data to display ? ?Initial Impression / Assessment and Plan / UC Course  ?I have reviewed the triage vital signs and the nursing notes. ? ?Pertinent labs & imaging results that were available during my care of the patient were reviewed by me and considered in my medical decision making (see chart for details). ? ?  ? ?Likely initially from seasonal allergy exacerbation but now given duration and worsening course will treat with Augmentin, Phenergan DM in addition to allergy regimen, over-the-counter supportive medications and home care.  Return for acutely worsening symptoms. ? ?Final Clinical Impressions(s) / UC Diagnoses  ? ?Final diagnoses:  ?Exposure to the flu  ?Upper respiratory tract infection, unspecified type  ?Seasonal allergic rhinitis due to other allergic trigger  ? ?Discharge Instructions   ?None ?  ? ?ED Prescriptions   ? ? Medication Sig Dispense Auth. Provider  ? amoxicillin-clavulanate (AUGMENTIN) 875-125 MG tablet Take 1 tablet by mouth every 12 (twelve) hours. 14 tablet Volney American, [place]  ? promethazine-dextromethorphan (PROMETHAZINE-DM) 6.25-15 MG/5ML syrup Take 5 mLs by mouth 4 (four) times daily as needed. 100 mL Volney American, [place]  ? ?  ? ?PDMP not reviewed this encounter. ?  ?Volney American, PA-C ?02/11/22 1505 ? ?

## 2022-02-11 NOTE — ED Triage Notes (Signed)
Pt states that she got a sore throat and congestion on February 18,2023. She states that he chest is hurting from coughing and her shoulders are hurting ? ?Pt states she was tested for Covid/ Flu and they were negative ? ?Pt states she was taking Mucinex with a cough suppressant without any relief ? ?Denies Fever ? ?

## 2022-02-13 LAB — COVID-19, FLU A+B NAA
Influenza A, NAA: NOT DETECTED
Influenza B, NAA: NOT DETECTED
SARS-CoV-2, NAA: NOT DETECTED

## 2022-04-13 ENCOUNTER — Other Ambulatory Visit: Payer: Self-pay | Admitting: Obstetrics and Gynecology

## 2022-04-13 DIAGNOSIS — Z1231 Encounter for screening mammogram for malignant neoplasm of breast: Secondary | ICD-10-CM

## 2022-04-18 ENCOUNTER — Ambulatory Visit
Admission: RE | Admit: 2022-04-18 | Discharge: 2022-04-18 | Disposition: A | Discharge status: Home / Self Care | Payer: BC Managed Care – PPO | Source: Ambulatory Visit | Attending: Obstetrics and Gynecology | Admitting: Obstetrics and Gynecology

## 2022-04-18 DIAGNOSIS — Z1231 Encounter for screening mammogram for malignant neoplasm of breast: Secondary | ICD-10-CM

## 2022-08-20 IMAGING — CT CT HEAD W/O CM
3 series · 16 of 47 positions shown, 19 images · non-contrast
Comparison: None.

CLINICAL DATA: Fall.

EXAM:
CT HEAD WITHOUT CONTRAST
TECHNIQUE: Contiguous axial images were obtained from the base of the skull
through the vertex without intravenous contrast.

[Series 2: head w o · axial · 0.43mm/px · z∈[+48,+173]mm · 10 of 30 slices shown, 13 images]
[im 3/30  brain]
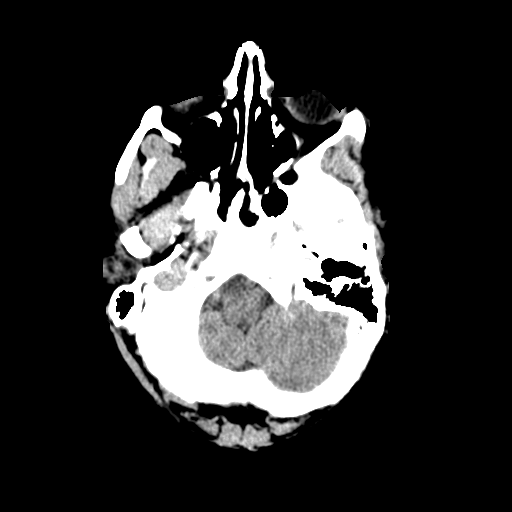
[im 3/30  bone]
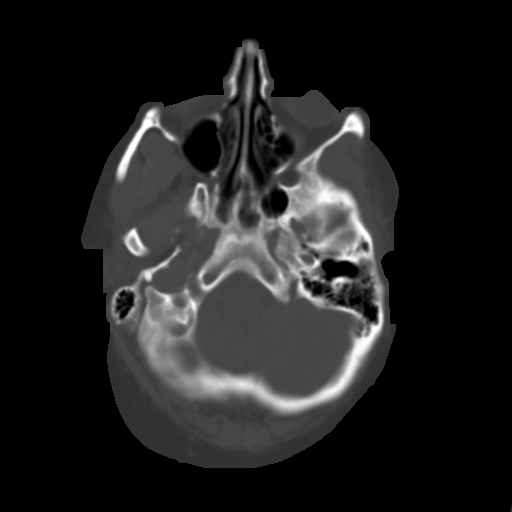
[im 6/30  brain]
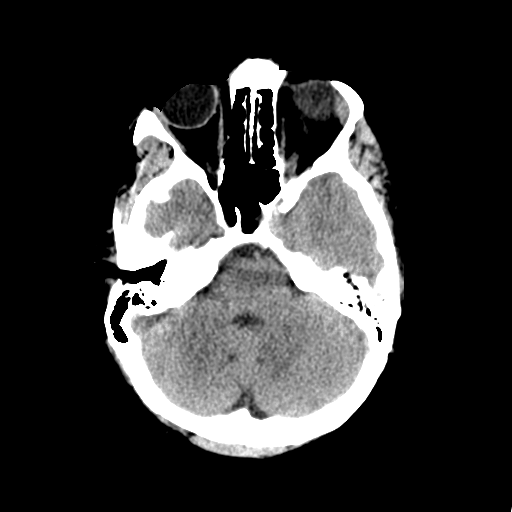
[im 9/30  brain]
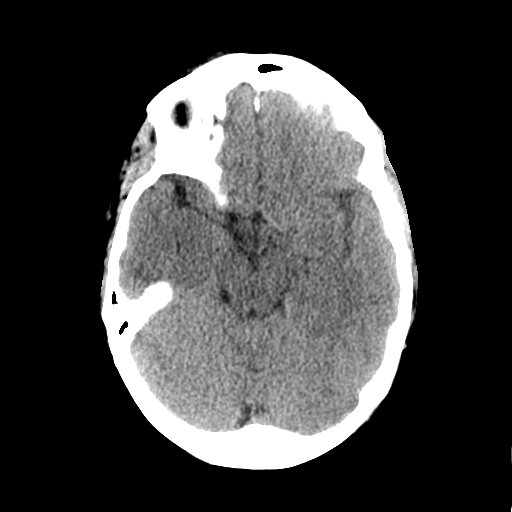
[im 11/30  brain]
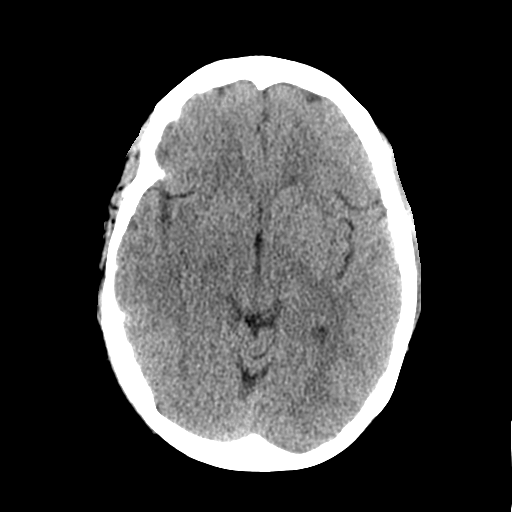
[im 14/30  brain]
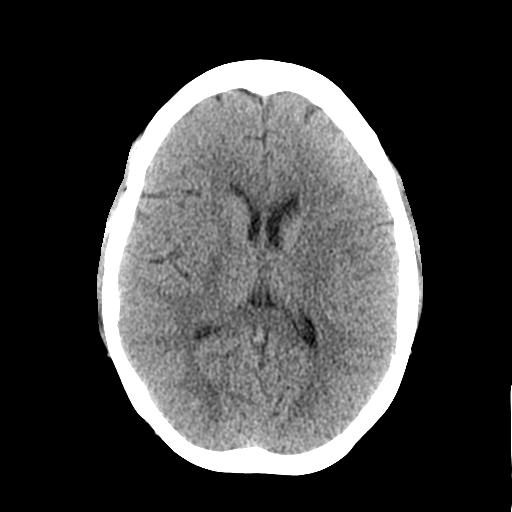
[im 14/30  bone]
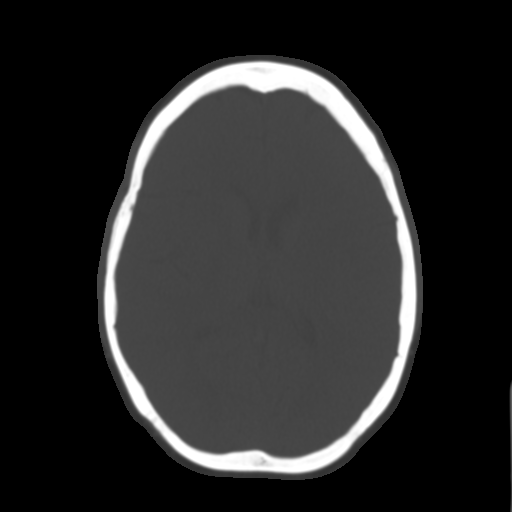
[im 17/30  brain]
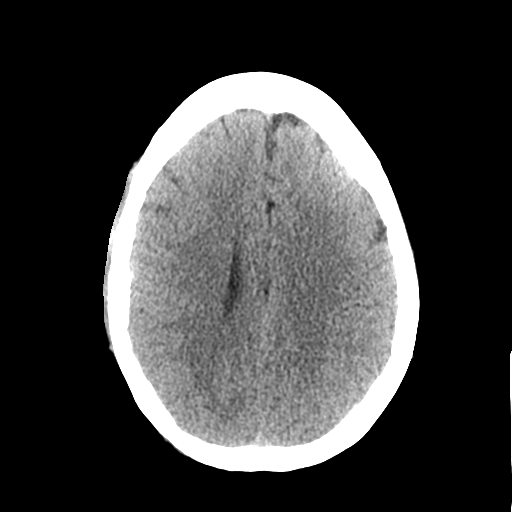
[im 20/30  brain]
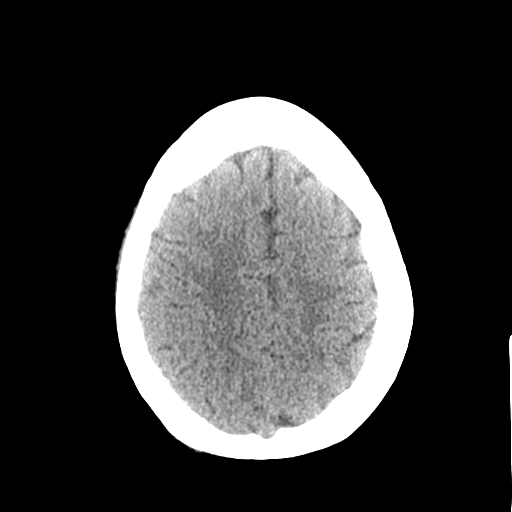
[im 23/30  brain]
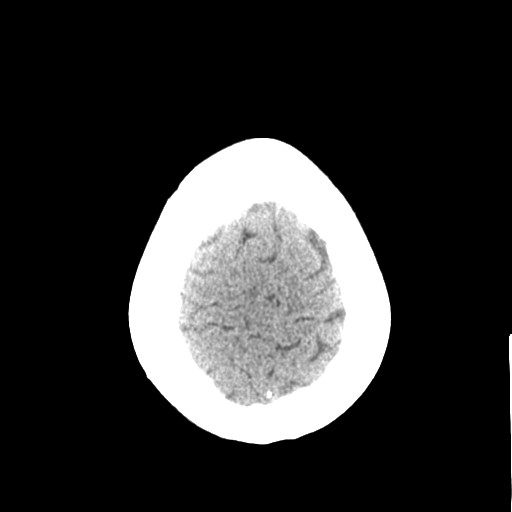
[im 25/30  brain]
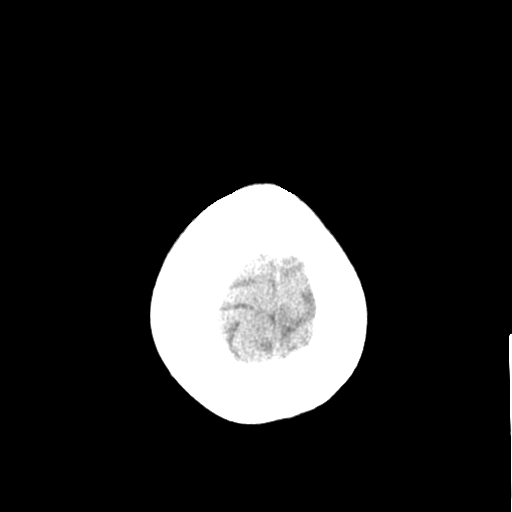
[im 25/30  bone]
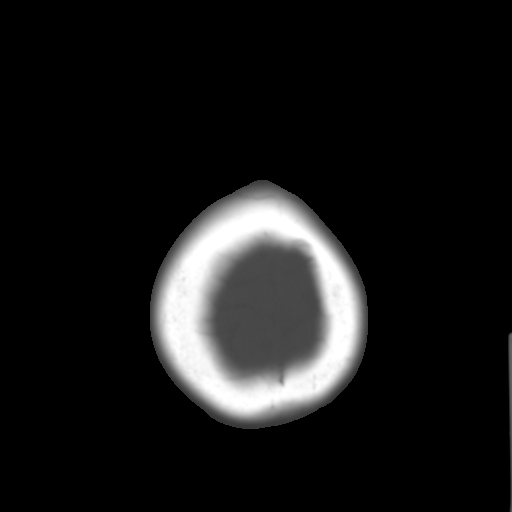
[im 28/30  brain]
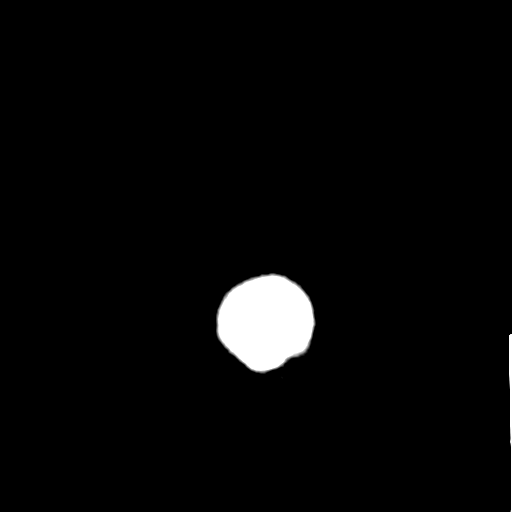

[Series 4: coronal soft · coronal · 0.31mm/px · 3 of 65 slices shown]
[im 22/65  brain]
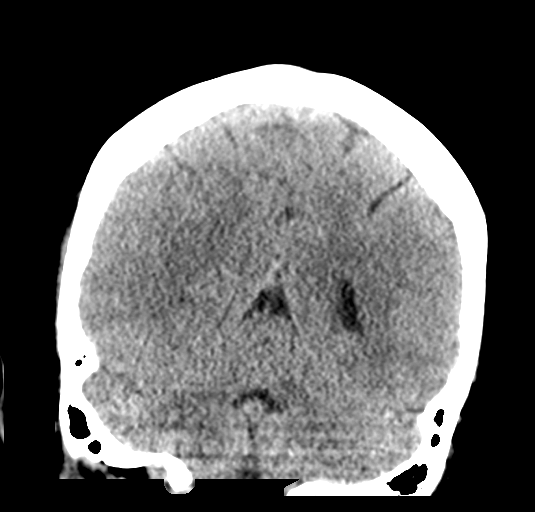
[im 29/65  brain]
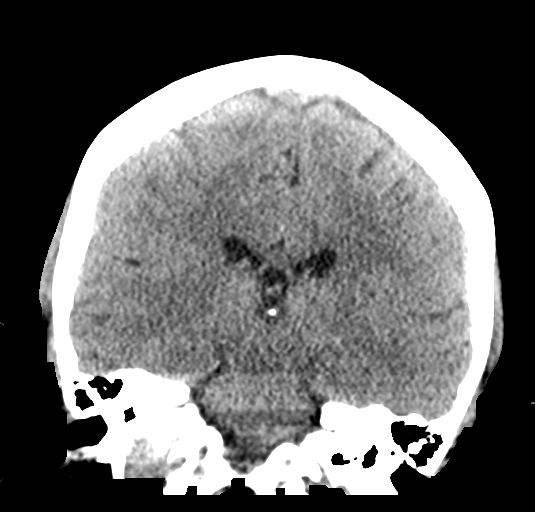
[im 36/65  brain]
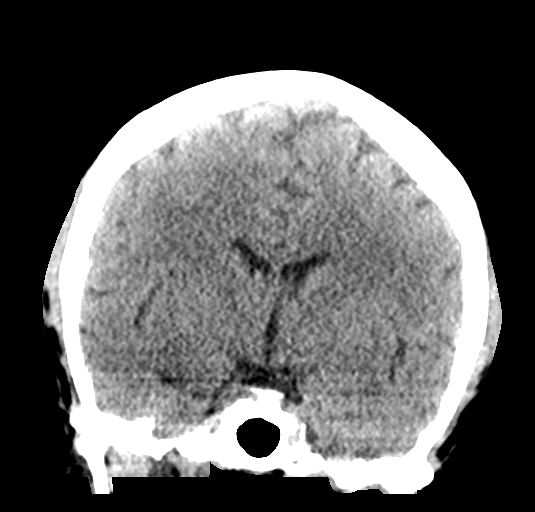

[Series 5: sagittal soft · sagittal · 0.30mm/px · 3 of 51 slices shown]
[im 17/51  brain]
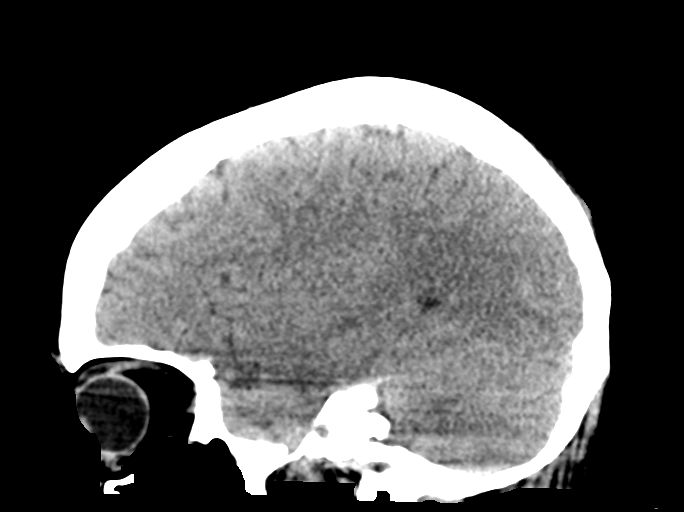
[im 26/51  brain]
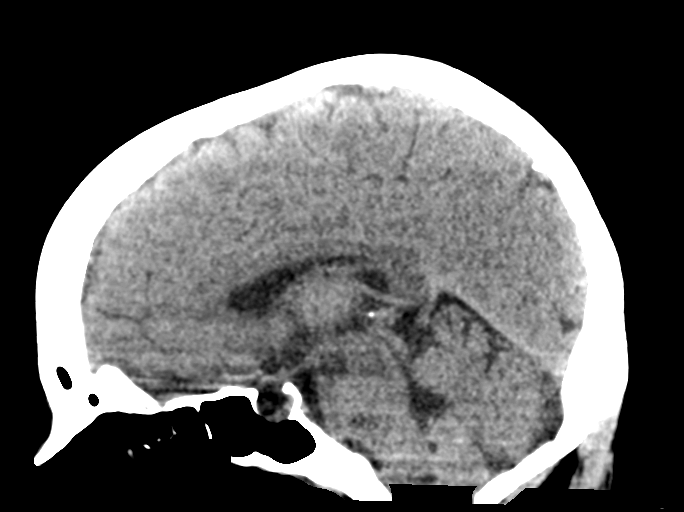
[im 34/51  brain]
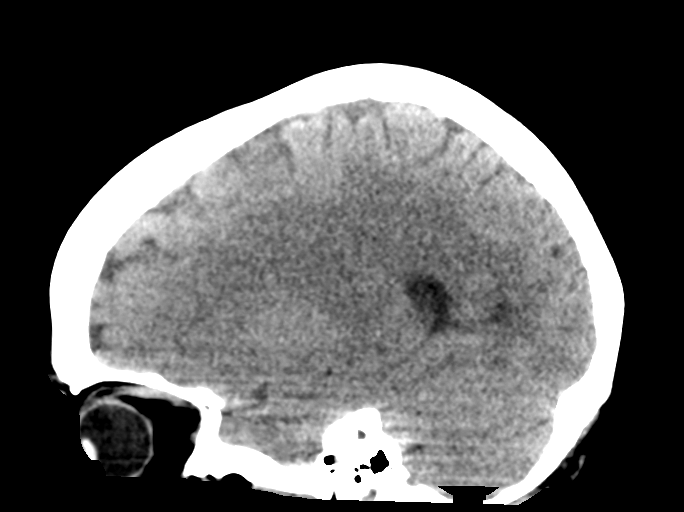

[16 of 47 positions shown; findings below may reference images not displayed]

FINDINGS: Brain: No evidence of acute infarction, hemorrhage, hydrocephalus,
extra-axial collection or mass lesion/mass effect.

Vascular: No hyperdense vessel or unexpected calcification.

Skull: Normal. Negative for fracture or focal lesion.

Sinuses/Orbits: No acute finding.

Other: There is mild posterior right parietal scalp soft tissue
swelling.
IMPRESSION: No acute intracranial abnormality.

## 2022-08-29 ENCOUNTER — Telehealth: Payer: BC Managed Care – PPO | Admitting: Physician Assistant

## 2022-08-29 DIAGNOSIS — M549 Dorsalgia, unspecified: Secondary | ICD-10-CM | POA: Diagnosis not present

## 2022-08-29 DIAGNOSIS — U071 COVID-19: Secondary | ICD-10-CM | POA: Diagnosis not present

## 2022-08-29 MED ORDER — GUAIFENESIN 100 MG/5ML PO LIQD
5.0000 mL | ORAL | 0 refills | Status: AC | PRN
Start: 2022-08-29 — End: ?

## 2022-08-29 NOTE — Patient Instructions (Signed)
[name], thank you for joining Rodney Booze, PA-C for today's virtual visit.  While this provider is not your primary care provider (PCP), if your PCP is located in our provider database this encounter information will be shared with them immediately following your visit.  Consent: (Patient) [name] provided verbal consent for this virtual visit at the beginning of the encounter.  Current Medications:  Current Outpatient Medications:    amoxicillin-clavulanate (AUGMENTIN) 875-125 MG tablet, Take 1 tablet by mouth every 12 (twelve) hours., Disp: 14 tablet, Rfl: 0   cyclobenzaprine (FLEXERIL) 10 MG tablet, TK 1 T PO TID PRF, Disp: , Rfl: 0   fluticasone (FLONASE) 50 MCG/ACT nasal spray, USE 2 SPRAYS IN EACH NOSTRIL DAILY FOR STUFFY NOSE OR DRAINAGE, Disp: 16 g, Rfl: 5   fluticasone (FLOVENT HFA) 220 MCG/ACT inhaler, Inhale 1 puff into the lungs 2 (two) times daily as needed. Eosinophilic esophagitis, Disp: , Rfl:    FLUVIRIN SUSP, Inject 0.5 mLs into the muscle as directed., Disp: , Rfl: 0   guaiFENesin-codeine (ROBITUSSIN AC) 100-10 MG/5ML syrup, Take 5 mLs by mouth 3 (three) times daily as needed for cough., Disp: 120 mL, Rfl: 0   levocetirizine (XYZAL) 5 MG tablet, Take 1 tablet (5 mg total) by mouth every evening., Disp: 30 tablet, Rfl: 2   levothyroxine (SYNTHROID) 25 MCG tablet, Take 25 mcg by mouth daily., Disp: , Rfl:    Magnesium 250 MG TABS, Take by mouth., Disp: , Rfl:    meloxicam (MOBIC) 15 MG tablet, TK 1 T PO D, Disp: , Rfl: 10   Multiple Vitamin (MULTIVITAMIN) capsule, Take by mouth., Disp: , Rfl:    Olopatadine HCl 0.2 % SOLN, PLACE 1 DROP IN BOTH EYES ONCE EVERY DAY, Disp: 2.5 mL, Rfl: 1   omega-3 acid ethyl esters (LOVAZA) 1 g capsule, Take by mouth 2 (two) times daily., Disp: , Rfl:    ondansetron (ZOFRAN ODT) 4 MG disintegrating tablet, 4mg  ODT q4 hours prn nausea/vomit, Disp: 4 tablet, Rfl: 0   ondansetron (ZOFRAN ODT) 4 MG disintegrating tablet, Take  1 tablet (4 mg total) by mouth every 6 (six) hours as needed for vomiting., Disp: 30 tablet, Rfl: 0   Oyster Shell Calcium 500 MG TABS, 1 tablet, Disp: , Rfl:    pantoprazole (PROTONIX) 40 MG tablet, Take 40 mg by mouth daily., Disp: , Rfl:    phentermine 30 MG capsule, Take 30 mg by mouth., Disp: , Rfl:    pravastatin (PRAVACHOL) 20 MG tablet, , Disp: , Rfl:    promethazine-dextromethorphan (PROMETHAZINE-DM) 6.25-15 MG/5ML syrup, Take 5 mLs by mouth 4 (four) times daily as needed., Disp: 100 mL, Rfl: 0   Medications ordered in this encounter:  No orders of the defined types were placed in this encounter.    *If you need refills on other medications prior to your next appointment, please contact your pharmacy*  Follow-Up: Call back or seek an in-person evaluation if the symptoms worsen or if the condition fails to improve as anticipated.  [facility] [phone]  Other Instructions Take the cough medication as directed   Rotate tylenol and motrin for pain and fevers   Follow up with your regular doctor in 1 week for reassessment and seek care sooner if your symptoms worsen or fail to improve.   If you have been instructed to have an in-person evaluation today at a local Urgent Care facility, please use the link below. It will take you to  a list of all of our available [facility], including address, phone number and hours of operation. Please do not delay care.  [facility]  If you or a family member do not have a primary care provider, use the link below to schedule a visit and establish care. When you choose a [facility] primary care physician or advanced practice provider, you gain a long-term partner in health. Find a Primary Care Provider  Learn more about [facility] in-office and virtual care options: [facility]

## 2022-08-29 NOTE — Progress Notes (Signed)
[name], [name] are scheduled for a virtual visit with your provider today.    Just as we do with appointments in the office, we must obtain your consent to participate.  Your consent will be active for this visit and any virtual visit you may have with one of our providers in the next 365 days.    If you have a MyChart account, I can also send a copy of this consent to you electronically.  All virtual visits are billed to your insurance company just like a traditional visit in the office.  As this is a virtual visit, video technology does not allow for your provider to perform a traditional examination.  This may limit your provider's ability to fully assess your condition.  If your provider identifies any concerns that need to be evaluated in person or the need to arrange testing such as labs, EKG, etc, we will make arrangements to do so.    Although advances in technology are sophisticated, we cannot ensure that it will always work on either your end or our end.  If the connection with a video visit is poor, we may have to switch to a telephone visit.  With either a video or telephone visit, we are not always able to ensure that we have a secure connection.   I need to obtain your verbal consent now.   Are you willing to proceed with your visit today?   [name] has provided verbal consent on 08/29/2022 for a virtual visit (video or telephone).   Virginia Meres, PA-C 08/29/2022  11:46 AM   Date:  08/29/2022   ID:  [name], DOB 05-22-1976, MRN [id number]  Patient Location: Home Provider Location: Home Office   Participants: Patient and Provider for Visit and Wrap up  Method of visit: Video  Location of Patient: Home Location of Provider: Home Office Consent was obtain for visit over the video. Services rendered by provider: Visit was performed via video  A video enabled telemedicine application was used and I verified that I am speaking with the correct person using two  identifiers.  PCP:  Virginia Found, MD   Chief Complaint:  covid and back pain  History of Present Illness:    [name] is a 46 y.o. female with history as stated below. Presents video telehealth for an acute care visit  Pt states she tested positive for covid. She started having sore throat, headache and cough 3 days ago. She denies fevers, chest pain, sob, vomiting. She further reports diarrhea. She has taken motrin for her symptoms. She is   She further reports that she has had left lower back pain that has been present intermittently for 4 months. Pain radiates to the left hip area. Pain worse in the morning and improved with movement and ambulation. Describes as a throbbing pain. Hx sciatica. Denies numbness/weakness, incontinence, fevers, ivdu or CA. No reported injury  Past Medical, Surgical, Social History, Allergies, and Medications have been Reviewed.  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     Allergies:   Patient has no known allergies.   ROS See HPI for history of present illness.  Physical Exam Pulmonary:     Comments: Speaking in full sentences              MDM: Pt tested positive for covid.  She is not interested in antivirals. She is asking for cough syrup. Sxs minimal at this time. Also c/o intermittent back pain for 4 months. No neuro complaints or red flag sxs other than duration of pain. Have advised that she f/u with pcp to discuss necessity of imaging and in the meantime can tx with otc antiinflammatories as sxs do seems consistent with sciatica.   There are no diagnoses linked to this encounter.   Time:   Today, I have spent 15 minutes with the patient with telehealth technology discussing the above problems, reviewing the chart, previous notes, medications and orders.    Tests  Ordered: No orders of the defined types were placed in this encounter.   Medication Changes: No orders of the defined types were placed in this encounter.    Disposition:  Follow up  Signed, Virginia Booze, PA-C  08/29/2022 11:46 AM

## 2022-12-12 ENCOUNTER — Other Ambulatory Visit (HOSPITAL_COMMUNITY)
Admission: RE | Admit: 2022-12-12 | Discharge: 2022-12-12 | Disposition: A | Discharge status: Home / Self Care | Payer: BC Managed Care – PPO | Source: Ambulatory Visit | Attending: Obstetrics and Gynecology | Admitting: Obstetrics and Gynecology

## 2022-12-12 ENCOUNTER — Other Ambulatory Visit: Payer: Self-pay | Admitting: Obstetrics and Gynecology

## 2022-12-12 DIAGNOSIS — Z01419 Encounter for gynecological examination (general) (routine) without abnormal findings: Secondary | ICD-10-CM | POA: Insufficient documentation

## 2022-12-15 LAB — CYTOLOGY - PAP
Comment: NEGATIVE
Diagnosis: NEGATIVE
High risk HPV: NEGATIVE

## 2023-02-14 ENCOUNTER — Other Ambulatory Visit: Payer: Self-pay | Admitting: Gastroenterology

## 2023-02-14 DIAGNOSIS — Z1211 Encounter for screening for malignant neoplasm of colon: Secondary | ICD-10-CM

## 2023-03-22 ENCOUNTER — Ambulatory Visit
Admission: RE | Admit: 2023-03-22 | Discharge: 2023-03-22 | Disposition: A | Discharge status: Home / Self Care | Payer: BC Managed Care – PPO | Source: Ambulatory Visit | Attending: Gastroenterology | Admitting: Gastroenterology

## 2023-03-22 DIAGNOSIS — Z1211 Encounter for screening for malignant neoplasm of colon: Secondary | ICD-10-CM

## 2023-05-10 ENCOUNTER — Other Ambulatory Visit: Payer: Self-pay | Admitting: Obstetrics and Gynecology

## 2023-05-10 DIAGNOSIS — Z1231 Encounter for screening mammogram for malignant neoplasm of breast: Secondary | ICD-10-CM

## 2023-06-22 ENCOUNTER — Ambulatory Visit
Admission: RE | Admit: 2023-06-22 | Discharge: 2023-06-22 | Disposition: A | Discharge status: Home / Self Care | Payer: BC Managed Care – PPO | Source: Ambulatory Visit | Attending: Obstetrics and Gynecology | Admitting: Obstetrics and Gynecology

## 2023-06-22 DIAGNOSIS — Z1231 Encounter for screening mammogram for malignant neoplasm of breast: Secondary | ICD-10-CM

## 2023-12-13 ENCOUNTER — Other Ambulatory Visit (HOSPITAL_COMMUNITY): Admit: 2023-12-13 | Payer: Self-pay

## 2024-01-10 DIAGNOSIS — D225 Melanocytic nevi of trunk: Secondary | ICD-10-CM | POA: Diagnosis not present

## 2024-01-10 DIAGNOSIS — Z1283 Encounter for screening for malignant neoplasm of skin: Secondary | ICD-10-CM | POA: Diagnosis not present

## 2024-01-24 DIAGNOSIS — Z8349 Family history of other endocrine, nutritional and metabolic diseases: Secondary | ICD-10-CM | POA: Diagnosis not present

## 2024-01-24 DIAGNOSIS — E039 Hypothyroidism, unspecified: Secondary | ICD-10-CM | POA: Diagnosis not present

## 2024-01-24 DIAGNOSIS — E063 Autoimmune thyroiditis: Secondary | ICD-10-CM | POA: Diagnosis not present

## 2024-01-24 DIAGNOSIS — H5789 Other specified disorders of eye and adnexa: Secondary | ICD-10-CM | POA: Diagnosis not present

## 2024-01-26 ENCOUNTER — Other Ambulatory Visit: Payer: Self-pay | Admitting: Internal Medicine

## 2024-01-26 DIAGNOSIS — E042 Nontoxic multinodular goiter: Secondary | ICD-10-CM

## 2024-02-01 DIAGNOSIS — E039 Hypothyroidism, unspecified: Secondary | ICD-10-CM | POA: Diagnosis not present

## 2024-02-01 DIAGNOSIS — Z6839 Body mass index (BMI) 39.0-39.9, adult: Secondary | ICD-10-CM | POA: Diagnosis not present

## 2024-02-01 DIAGNOSIS — E6609 Other obesity due to excess calories: Secondary | ICD-10-CM | POA: Diagnosis not present

## 2024-02-01 DIAGNOSIS — E782 Mixed hyperlipidemia: Secondary | ICD-10-CM | POA: Diagnosis not present

## 2024-02-08 DIAGNOSIS — Z01419 Encounter for gynecological examination (general) (routine) without abnormal findings: Secondary | ICD-10-CM | POA: Diagnosis not present

## 2024-02-15 ENCOUNTER — Other Ambulatory Visit (HOSPITAL_COMMUNITY)
Admission: RE | Admit: 2024-02-15 | Discharge: 2024-02-15 | Disposition: A | Discharge status: Home / Self Care | Source: Ambulatory Visit | Attending: Internal Medicine | Admitting: Internal Medicine

## 2024-02-15 ENCOUNTER — Ambulatory Visit
Admission: RE | Admit: 2024-02-15 | Discharge: 2024-02-15 | Disposition: A | Discharge status: Home / Self Care | Payer: Self-pay | Source: Ambulatory Visit | Attending: Internal Medicine | Admitting: Internal Medicine

## 2024-02-15 DIAGNOSIS — E042 Nontoxic multinodular goiter: Secondary | ICD-10-CM | POA: Diagnosis not present

## 2024-02-15 DIAGNOSIS — D34 Benign neoplasm of thyroid gland: Secondary | ICD-10-CM | POA: Diagnosis not present

## 2024-02-15 DIAGNOSIS — E041 Nontoxic single thyroid nodule: Secondary | ICD-10-CM | POA: Diagnosis not present

## 2024-02-19 LAB — CYTOLOGY - NON PAP

## 2024-02-21 DIAGNOSIS — E041 Nontoxic single thyroid nodule: Secondary | ICD-10-CM | POA: Diagnosis not present

## 2024-03-01 ENCOUNTER — Encounter (HOSPITAL_COMMUNITY): Payer: Self-pay

## 2024-03-14 ENCOUNTER — Other Ambulatory Visit: Payer: Self-pay

## 2024-03-14 ENCOUNTER — Emergency Department (HOSPITAL_COMMUNITY)
Admission: EM | Admit: 2024-03-14 | Discharge: 2024-03-15 | Disposition: A | Discharge status: Home / Self Care | Attending: Emergency Medicine | Admitting: Emergency Medicine

## 2024-03-14 DIAGNOSIS — R55 Syncope and collapse: Secondary | ICD-10-CM | POA: Diagnosis not present

## 2024-03-14 DIAGNOSIS — E861 Hypovolemia: Secondary | ICD-10-CM | POA: Diagnosis not present

## 2024-03-14 DIAGNOSIS — E876 Hypokalemia: Secondary | ICD-10-CM | POA: Diagnosis not present

## 2024-03-14 DIAGNOSIS — R197 Diarrhea, unspecified: Secondary | ICD-10-CM | POA: Insufficient documentation

## 2024-03-14 LAB — BASIC METABOLIC PANEL WITH GFR
Anion gap: 7 (ref 5–15)
BUN: 9 mg/dL (ref 6–20)
CO2: 19 mmol/L — ABNORMAL LOW (ref 22–32)
Calcium: 8.9 mg/dL (ref 8.9–10.3)
Chloride: 109 mmol/L (ref 98–111)
Creatinine, Ser: 0.8 mg/dL (ref 0.44–1.00)
GFR, Estimated: >60 mL/min (ref >60)
Glucose, Bld: 122 mg/dL — ABNORMAL HIGH (ref 70–99)
Potassium: 3.4 mmol/L — ABNORMAL LOW (ref 3.5–5.1)
Sodium: 135 mmol/L (ref 135–145)

## 2024-03-14 LAB — LIPASE, BLOOD: Lipase: 46 U/L (ref 11–51)

## 2024-03-14 LAB — URINALYSIS, ROUTINE W REFLEX MICROSCOPIC
Bacteria, UA: NONE SEEN
Bilirubin Urine: NEGATIVE
Glucose, UA: NEGATIVE mg/dL
Ketones, ur: NEGATIVE mg/dL
Leukocytes,Ua: NEGATIVE
Nitrite: NEGATIVE
Protein, ur: 30 mg/dL — AB
Specific Gravity, Urine: 1.02 (ref 1.005–1.030)
pH: 5 (ref 5.0–8.0)

## 2024-03-14 LAB — CBC
HCT: 43.2 % (ref 36.0–46.0)
Hemoglobin: 14.7 g/dL (ref 12.0–15.0)
MCH: 29.5 pg (ref 26.0–34.0)
MCHC: 34 g/dL (ref 30.0–36.0)
MCV: 86.6 fL (ref 80.0–100.0)
Platelets: 288 10*3/uL (ref 150–400)
RBC: 4.99 MIL/uL (ref 3.87–5.11)
RDW: 13.7 % (ref 11.5–15.5)
WBC: 10.9 10*3/uL — ABNORMAL HIGH (ref 4.0–10.5)
nRBC: 0 % (ref 0.0–0.2)

## 2024-03-14 NOTE — ED Triage Notes (Signed)
 Pt states she has had diarrhea x1 day, reports she was using the restroom when she had a syncopal episode, falling off of the commode. Denies any injury. Pt is alert and oriented in triage.   Pt also reports she started Semaglutide injections on Saturday.

## 2024-03-15 LAB — MAGNESIUM: Magnesium: 1.9 mg/dL (ref 1.7–2.4)

## 2024-03-15 MED ORDER — LACTATED RINGERS IV BOLUS
1000.0000 mL | Freq: Once | INTRAVENOUS | Status: AC
  Administered 2024-03-15: 1000 mL via INTRAVENOUS

## 2024-03-15 NOTE — ED Provider Notes (Signed)
 [place] EMERGENCY DEPARTMENT AT [facility] Provider Note   CSN: [id number] Arrival date & time: 03/14/24  1921     History Chief Complaint  Patient presents with   Loss of Consciousness    [name] is a 48 y.o. female.  48 year old female who presents ER today with an episode of syncope.  Patient states she has had really bad diarrhea for last couple days.  She states he is probably had a bowel movement at least 20 times.  Just watery no blood.  She was nauseous intermittently with that but not persistently.  No fevers.  No suspicious food intake.  She did recently start semaglutide back at the lowest dose and has had decreased intake because of that but no nausea or persistent symptoms with that.  She does have a hiatal hernia and indigestion/heartburn and that was a little bit worse over the weekend.  She states that when she was walking to the bathroom because she had to go diarrhea as she felt lightheaded little bit sweaty little bit nauseous and weak like she was going to pass out.  After she got in having her bowel movement she did syncopized.  No trauma during the syncope.  No pain anywhere at this time.  Denies any severe headache, abdominal pain, chest pain, back pain or other symptoms prior or after the syncopal episode.   Loss of Consciousness      Home Medications Prior to Admission medications   Medication Sig Start Date End Date Taking? Authorizing Provider  amoxicillin-clavulanate (AUGMENTIN) 875-125 MG tablet Take 1 tablet by mouth every 12 (twelve) hours. 02/11/22   Particia Nearing, PA-C  cyclobenzaprine (FLEXERIL) 10 MG tablet TK 1 T PO TID PRF 06/06/16   [provider]  fluticasone (FLONASE) 50 MCG/ACT nasal spray USE 2 SPRAYS IN EACH NOSTRIL DAILY FOR STUFFY NOSE OR DRAINAGE 06/05/17   Bobbitt, Heywood Iles, MD  fluticasone (FLOVENT HFA) 220 MCG/ACT inhaler Inhale 1 puff into the lungs 2 (two) times daily as needed.  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 09/19/16   [provider]  guaiFENesin (COUGH SYRUP) 100 MG/5ML liquid Take 5 mLs by mouth every 4 (four) hours as needed for cough or to loosen phlegm. 08/29/22   Couture, Cortni S, PA-C  guaiFENesin-codeine (ROBITUSSIN AC) 100-10 MG/5ML syrup Take 5 mLs by mouth 3 (three) times daily as needed for cough. 04/13/21   Sherald Barge, FNP  levocetirizine (XYZAL) 5 MG tablet Take 1 tablet (5 mg total) by mouth every evening. 04/13/21   Sherald Barge, FNP  levothyroxine (SYNTHROID) 25 MCG tablet Take 25 mcg by mouth daily. 02/08/22   [provider]  Magnesium 250 MG TABS Take by mouth.    [provider]  meloxicam (MOBIC) 15 MG tablet TK 1 T PO D 04/14/16   [provider]  Multiple Vitamin (MULTIVITAMIN) capsule Take by mouth.    [provider]  Olopatadine HCl 0.2 % SOLN PLACE 1 DROP IN BOTH EYES ONCE EVERY DAY 12/25/17   Bobbitt, Heywood Iles, MD  omega-3 acid ethyl esters (LOVAZA) 1 g capsule Take by mouth 2 (two) times daily.    [provider]  ondansetron (ZOFRAN ODT) 4 MG disintegrating tablet 4mg  ODT q4 hours prn nausea/vomit 09/03/21   Violanda Bobeck, Barbara Cower, MD  ondansetron (ZOFRAN ODT) 4 MG disintegrating tablet Take 1 tablet (4 mg total) by mouth every  6 (six) hours as needed for vomiting. 09/03/21   Kaeya Schiffer, Barbara Cower, MD  Ethelda Chick Calcium 500 MG TABS 1 tablet    [provider]  pantoprazole (PROTONIX) 40 MG tablet Take 40 mg by mouth daily. 11/22/21   [provider]  phentermine 30 MG capsule Take 30 mg by mouth. 05/23/16 06/22/16  [provider]  pravastatin (PRAVACHOL) 20 MG tablet  12/01/15   [provider]  promethazine-dextromethorphan (PROMETHAZINE-DM) 6.25-15 MG/5ML syrup Take 5 mLs by mouth 4 (four) times daily as needed. 02/11/22   Particia Nearing, PA-C       Allergies    Patient has no known allergies.    Review of Systems   Review of Systems  Cardiovascular:  Positive for syncope.    Physical Exam Updated Vital Signs BP 135/84   Pulse 97   Temp 97.8 F (36.6 C) (Oral)   Resp 18   Ht 5\' 5"  (1.651 m)   Wt 109.3 kg   SpO2 99%   BMI 40.10 kg/m  Physical Exam Vitals and nursing note reviewed.  Constitutional:      Appearance: She is well-developed.  HENT:     Head: Normocephalic and atraumatic.  Eyes:     Pupils: Pupils are equal, round, and reactive to light.  Cardiovascular:     Rate and Rhythm: Normal rate and regular rhythm.  Pulmonary:     Effort: No respiratory distress.     Breath sounds: No stridor.  Abdominal:     General: There is no distension.  Musculoskeletal:        General: No swelling or tenderness. Normal range of motion.     Cervical back: Normal range of motion.  Skin:    General: Skin is warm and dry.  Neurological:     General: No focal deficit present.     Mental Status: She is alert.     Comments: No altered mental status, able to give full seemingly accurate history.  Face is symmetric, EOM's intact, pupils equal and reactive, vision intact, tongue and uvula midline without deviation. Upper and Lower extremity motor 5/5, intact pain perception in distal extremities, 2+ reflexes in biceps, patella and achilles tendons. Able to perform finger to nose normal with both hands. Walks without assistance or evident ataxia.       ED Results / Procedures / Treatments   Labs (all labs ordered are listed, but only abnormal results are displayed) Labs Reviewed  BASIC METABOLIC PANEL WITH GFR - Abnormal; Notable for the following components:      Result Value   Potassium 3.4 (*)    CO2 19 (*)    Glucose, Bld 122 (*)    All other components within normal limits  CBC - Abnormal; Notable for the following components:   WBC 10.9 (*)    All other components within normal limits  URINALYSIS, ROUTINE W  REFLEX MICROSCOPIC - Abnormal; Notable for the following components:   APPearance HAZY (*)    Hgb urine dipstick SMALL (*)    Protein, ur 30 (*)    All other components within normal limits  LIPASE, BLOOD  MAGNESIUM    EKG EKG Interpretation Date/Time:  Thursday March 14 2024 19:32:58 EDT Ventricular Rate:  91 PR Interval:  184 QRS Duration:  88 QT Interval:  326 QTC Calculation: 400 R Axis:   36  Text Interpretation: Normal sinus rhythm Cannot rule out Anterior infarct , age undetermined Abnormal ECG No previous ECGs available Confirmed by  Annalie Wenner, Barbara Cower [phone]) on 03/15/2024 12:32:16 AM  Radiology No results found.  Procedures Procedures    Medications Ordered in ED Medications  lactated ringers bolus 1,000 mL (1,000 mLs Intravenous New Bag/Given 03/15/24 0117)    ED Course/ Medical Decision Making/ A&P                                 Medical Decision Making Amount and/or Complexity of Data Reviewed Labs: ordered.   Overall reassuring.  I suspect it is dehydration from decreased intake and GI losses.  Potassium slightly low but not to the point where it should be causing arrhythmia.  Suspect is more likely probably hypovolemic and orthostatic type symptoms.  Will give some fluids and have her ambulate get orthostatic vital signs.  No indication for further workup or imaging at this time.  Orthostatics good. Ambulated a couple times withotu difficulty or recurrent symptoms. Will continue hydration at home. No diarrhea here. Fu w/ pcp for recheck.        Final Clinical Impression(s) / ED Diagnoses Final diagnoses:  None    Rx / DC Orders ED Discharge Orders     None         Addley Ballinger, Barbara Cower, MD 03/15/24 [phone]

## 2024-03-15 NOTE — ED Notes (Signed)
Pt ambulated to bathroom and back to bed without difficulty

## 2024-04-19 DIAGNOSIS — K529 Noninfective gastroenteritis and colitis, unspecified: Secondary | ICD-10-CM | POA: Diagnosis not present

## 2024-04-19 DIAGNOSIS — E6609 Other obesity due to excess calories: Secondary | ICD-10-CM | POA: Diagnosis not present

## 2024-04-19 DIAGNOSIS — Z6838 Body mass index (BMI) 38.0-38.9, adult: Secondary | ICD-10-CM | POA: Diagnosis not present

## 2024-05-16 DIAGNOSIS — R198 Other specified symptoms and signs involving the digestive system and abdomen: Secondary | ICD-10-CM | POA: Diagnosis not present

## 2024-05-16 DIAGNOSIS — Z1211 Encounter for screening for malignant neoplasm of colon: Secondary | ICD-10-CM | POA: Diagnosis not present

## 2024-05-16 DIAGNOSIS — K219 Gastro-esophageal reflux disease without esophagitis: Secondary | ICD-10-CM | POA: Diagnosis not present

## 2024-05-16 DIAGNOSIS — K200 Eosinophilic esophagitis: Secondary | ICD-10-CM | POA: Diagnosis not present

## 2024-06-05 ENCOUNTER — Other Ambulatory Visit: Payer: Self-pay | Admitting: Obstetrics and Gynecology

## 2024-06-05 DIAGNOSIS — Z1231 Encounter for screening mammogram for malignant neoplasm of breast: Secondary | ICD-10-CM

## 2024-06-12 DIAGNOSIS — R1030 Lower abdominal pain, unspecified: Secondary | ICD-10-CM | POA: Diagnosis not present

## 2024-06-13 DIAGNOSIS — R197 Diarrhea, unspecified: Secondary | ICD-10-CM | POA: Diagnosis not present

## 2024-06-26 ENCOUNTER — Ambulatory Visit

## 2024-12-12 ENCOUNTER — Ambulatory Visit: Payer: Self-pay | Admitting: Family Medicine

## 2025-03-10 ENCOUNTER — Ambulatory Visit: Payer: Self-pay

## 2025-03-12 ENCOUNTER — Ambulatory Visit
